# Patient Record
Sex: Male | Born: 1975 | Race: White | Hispanic: No | Marital: Single | Smoking: Former smoker
Health system: Southern US, Community
[De-identification: ages and names within clinical notes are randomized; demographics above are authoritative.]

## PROBLEM LIST (undated history)

## (undated) DIAGNOSIS — F329 Major depressive disorder, single episode, unspecified: Secondary | ICD-10-CM

## (undated) DIAGNOSIS — F1010 Alcohol abuse, uncomplicated: Secondary | ICD-10-CM

## (undated) DIAGNOSIS — K219 Gastro-esophageal reflux disease without esophagitis: Secondary | ICD-10-CM

## (undated) DIAGNOSIS — F32A Depression, unspecified: Secondary | ICD-10-CM

## (undated) DIAGNOSIS — E785 Hyperlipidemia, unspecified: Secondary | ICD-10-CM

## (undated) HISTORY — DX: Hyperlipidemia, unspecified: E78.5

## (undated) HISTORY — DX: Depression, unspecified: F32.A

## (undated) HISTORY — DX: Alcohol abuse, uncomplicated: F10.10

## (undated) HISTORY — DX: Gastro-esophageal reflux disease without esophagitis: K21.9

## (undated) HISTORY — DX: Major depressive disorder, single episode, unspecified: F32.9

---
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
## 2003-01-02 ENCOUNTER — Encounter: Payer: Self-pay | Admitting: Family Medicine

## 2008-07-01 ENCOUNTER — Ambulatory Visit: Payer: Self-pay | Admitting: Family Medicine

## 2008-07-01 DIAGNOSIS — K259 Gastric ulcer, unspecified as acute or chronic, without hemorrhage or perforation: Secondary | ICD-10-CM | POA: Insufficient documentation

## 2008-07-01 DIAGNOSIS — K219 Gastro-esophageal reflux disease without esophagitis: Secondary | ICD-10-CM

## 2008-07-01 DIAGNOSIS — T7840XA Allergy, unspecified, initial encounter: Secondary | ICD-10-CM | POA: Insufficient documentation

## 2008-07-01 DIAGNOSIS — F3341 Major depressive disorder, recurrent, in partial remission: Secondary | ICD-10-CM

## 2008-07-02 LAB — CONVERTED CEMR LAB
ALT: 35 units/L (ref 0–53)
AST: 29 units/L (ref 0–37)
Albumin: 4.7 g/dL (ref 3.5–5.2)
BUN: 9 mg/dL (ref 6–23)
Calcium: 9.7 mg/dL (ref 8.4–10.5)
Creatinine, Ser: 1.2 mg/dL (ref 0.4–1.5)
H Pylori IgG: NEGATIVE
Lipase: 19 units/L (ref 11.0–59.0)
Total Protein: 8.4 g/dL — ABNORMAL HIGH (ref 6.0–8.3)

## 2008-07-30 ENCOUNTER — Ambulatory Visit: Payer: Self-pay | Admitting: Family Medicine

## 2008-09-01 ENCOUNTER — Ambulatory Visit: Payer: Self-pay | Admitting: Family Medicine

## 2009-01-05 ENCOUNTER — Ambulatory Visit: Payer: Self-pay | Admitting: Family Medicine

## 2009-01-05 ENCOUNTER — Encounter (INDEPENDENT_AMBULATORY_CARE_PROVIDER_SITE_OTHER): Payer: Self-pay | Admitting: *Deleted

## 2009-02-02 ENCOUNTER — Ambulatory Visit: Payer: Self-pay | Admitting: Gastroenterology

## 2009-02-18 ENCOUNTER — Ambulatory Visit: Payer: Self-pay | Admitting: Family Medicine

## 2009-04-16 ENCOUNTER — Ambulatory Visit: Payer: Self-pay | Admitting: Family Medicine

## 2010-02-23 NOTE — Assessment & Plan Note (Signed)
Summary: 4-5 WEEK FOLLOW UP/RBH   Vital Signs:  Patient profile:   35 year old male Height:      68 inches Weight:      176.6 pounds Temp:     98.0 degrees F oral Pulse rate:   68 / minute Pulse rhythm:   regular BP sitting:   110 / 70  Vitals Entered By: Benny Lennert CMA Duncan Dull) (April 16, 2009 7:56 AM) CC: 4-5 week follow up , Depression   Depression History:      The patient comes in today for a maintenance visit due to his depression.  He notes that the symptoms started approximately 03/24/2008.  The patient denies a depressed mood most of the day and a diminished interest in his usual daily activities.  The patient denies insomnia, hypersomnia, psychomotor agitation, fatigue (loss of energy), feelings of worthlessness (guilt), impaired concentration (indecisiveness), and recurrent thoughts of death or suicide.        The patient denies that he feels like life is not worth living, denies that he wishes that he were dead, and denies that he has thought about ending his life.        Comments:  DOING MUCH BETTER.  Depression Treatment History:  Prior Medication Used:   Start Date: Assessment of Effect:   Comments:  Wellbutrin (bupropion)     --     much improvement     --  Allergies (verified): No Known Drug Allergies   Impression & Recommendations:  Problem # 1:  DEPRESSION (ICD-311) >15 minutes spent in face to face time with patient, >50% spent in counselling or coordination of care: reviewed symptoms, interested in doing things more, more active getting out hiking, smiling and happier, work is going better, and overall doing well.   His updated medication list for this problem includes:    Bupropion Hcl 300 Mg Xr24h-tab (Bupropion hcl) .Marland Kitchen... 1 by mouth daily    Citalopram Hydrobromide 20 Mg Tabs (Citalopram hydrobromide) .Marland Kitchen... 1 by mouth daily  Complete Medication List: 1)  Bupropion Hcl 300 Mg Xr24h-tab (Bupropion hcl) .Marland Kitchen.. 1 by mouth daily 2)  Lansoprazole 15 Mg  Cpdr (Lansoprazole) .Marland Kitchen.. 1 by mouth 30 minutes before breakfast 3)  Anamantle Hc 3-0.5 % Crea (Lidocaine-hydrocortisone ace) .... Use rectally two times a day 4)  Citalopram Hydrobromide 20 Mg Tabs (Citalopram hydrobromide) .Marland Kitchen.. 1 by mouth daily  Current Allergies (reviewed today): No known allergies

## 2010-02-23 NOTE — Assessment & Plan Note (Signed)
Summary: RECTAL BLEED & HX OF ULCER/YF   History of Present Illness Visit Type: Initial Consult Primary GI MD: Elie Goody MD [facility] Primary Provider: Hannah Beat, MD Requesting Provider: Hannah Beat, MD Chief Complaint: Rectal bleeding, BRB on tissue and in toilet; ulcer has not healed completely History of Present Illness:   This is a 35 year old white male relates a several year history of intermittent small volume hematochezia and epigastric pain with reflux symptoms and solid food dysphagia. He states he was diagnosed with an ulcer based on dyspeptic symptoms around 2007 when he lived in [place]. He has not previously had an upper GI series or upper endoscopy.  He also has had chronic constipation with occasional loose stools and small amounts of bright red blood per rectum associated with bowel movements. Bleeding has recently occurred about twice a week and has had several episodes of large amounts of blood per rectum. He states he takes his lansoprazole intermittently although he feels better when he takes a regularly. Over the past several weeks he has noted rectal pain with bowel movements, and a small fissure was noted on rectal examination by Dr. Patsy Lager.    GI Review of Systems    Reports abdominal pain, acid reflux, dysphagia with solids, loss of appetite, and  nausea.     Location of  Abdominal pain: epigastric area.    Denies belching, bloating, chest pain, dysphagia with liquids, heartburn, vomiting, vomiting blood, weight loss, and  weight gain.      Reports constipation, rectal bleeding, and  rectal pain.     Denies anal fissure, black tarry stools, change in bowel habit, diarrhea, diverticulosis, fecal incontinence, heme positive stool, hemorrhoids, irritable bowel syndrome, jaundice, light color stool, and  liver problems. Preventive Screening-Counseling & Management  Alcohol-Tobacco     Smoking Status: quit  Current Medications (verified): 1)  Bupropion  Hcl 300 Mg Xr24h-Tab (Bupropion Hcl) .Marland Kitchen.. 1 By Mouth Daily 2)  Lansoprazole 15 Mg Cpdr (Lansoprazole) .Marland Kitchen.. 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 Tonsillectomy 1990  Family History: Reviewed history from 07/01/2008 and no changes required. Family History Depression Family History Diabetes 1st degree relative Family History High cholesterol Family History Hypertension Family History of  skin cancer  Social History: Occupation: Facilities manager Alcohol use-yes (2-7 drinks a night) Drug use-no Regular exercise-no Patient is a former smoker.  Daily Caffeine Use 3-4 Smoking Status:  quit  Review of Systems       The patient complains of anxiety-new, change in vision, depression-new, sleeping problems, and vision changes.         The pertinent positives and negatives are noted as above and in the HPI. All other ROS were reviewed and were negative.   Vital Signs:  Patient profile:   35 year old male Height:      68 inches Weight:      176 pounds BMI:     26.86 Pulse rate:   68 / minute Pulse rhythm:   regular BP sitting:   110 / 78  (right arm) Cuff size:   regular  Vitals Entered By: June McMurray CMA Duncan Dull) (February 02, 2009 3:29 PM)  Physical Exam  General:  Well developed, well nourished, no acute distress. Head:  Normocephalic and atraumatic. Eyes:  PERRLA,  no icterus. Mouth:  No deformity or lesions, dentition normal. Chest Wall:  Tenderness over left anterior chest wall. Lungs:  Clear throughout to auscultation. Heart:  Regular rate and rhythm; no murmurs, rubs,  or bruits. Abdomen:  Soft, nontender and nondistended. No masses, hepatosplenomegaly or hernias noted. Normal bowel  sounds. Rectal:  deferred until time of colonoscopy. Rrecent exam by Dr. Patsy Lager revealed a small fissure and no other abnormalities. Neurologic:  Alert and  oriented x4;  grossly normal neurologically. Psych:  Alert and cooperative. depressed, flat affect.    Impression & Recommendations:  Problem # 1:  GERD (ICD-530.81) Chronic reflux symptoms, which are controlled with medications however the patient takes them sporadically. He is strongly advised to take lansoprazole on a daily basis at least for the next 4-6 weeks. The risks, benefits and alternatives to endoscopy with possible biopsy and possible dilation were discussed with the patient and they consent to proceed. The procedure will be scheduled electively. The patient states he cannot schedule at this time due to the requirement to have a driver. He states he will call back.  Problem # 2:  ABDOMINAL PAIN, EPIGASTRIC (ICD-789.06) Probably related to reflux disease. Rule out ulcer disease. EGD as above.  Problem # 3:  BLOOD IN STOOL (ICD-578.1) Recurrent rectal bleeding and new onset rectal pain. Has a small anal fissure, recently diagnosed. Rule out colorectal, neoplasms, iInternal hemorrhoids, inflammatory bowel disease, and other disorders. The risks, benefits and alternatives to colonoscopy with possible biopsy and possible polypectomy were discussed with the patient and they consent to proceed. The procedure will be scheduled electively. The patient states he cannot schedule at this time due to the requirement to have a driver. He states he will call back.  Problem # 4:  CONSTIPATION (ICD-564.00) Increase dietary fiber and fluid intake.  Problem # 5:  DYSPHAGIA (ICD-787.29) Solid food dysphagia, likely related to stricture. Rule out other disorders. Further evaluation treatment with endoscopy with possible dilation as above.  Patient Instructions: 1)  Call back to schedule your Endoscopy/ Colonoscopy after you find out who can bring  you to the procedure.  2)  Copy sent to : Hannah Beat, MD 3)  The medication list was reviewed and reconciled.  All changed / newly prescribed medications were explained.  A complete medication list was provided to the patient / caregiver. 4)  Avoid foods high in acid content ( tomatoes, citrus juices, spicy foods) . Avoid eating within 3 to 4 hours of lying down or before exercising. Do not over eat; try smaller more frequent meals. Elevate head of bed four inches when sleeping.  5)  High Fiber, Low Fat  Healthy Eating Plan brochure given.   Appended Document: RECTAL BLEED & HX OF ULCER/YF    Clinical Lists Changes  Medications: Added new medication of ANAMANTLE HC 3-0.5 % CREA (LIDOCAINE-HYDROCORTISONE ACE) use rectally two times a day - Signed Rx of ANAMANTLE HC 3-0.5 % CREA (LIDOCAINE-HYDROCORTISONE ACE) use rectally two times a day;  #1 tube x 2;  Signed;  Entered by: Christie Nottingham CMA (AAMA);  Authorized by: Meryl Dare MD [facility];  Method used: Electronically to [facility] [address], [place], [place], [place]  [address], Ph: [phone], Fax: [phone]    Prescriptions: ANAMANTLE HC 3-0.5 % CREA (LIDOCAINE-HYDROCORTISONE ACE) use rectally two times a day  #1 tube x 2   Entered by:   Christie Nottingham CMA (AAMA)   Authorized by:   Meryl Dare MD [facility]  Signed by:   Christie Nottingham CMA (AAMA) on 02/03/2009   Method used:   [facility]ically [facility]        [facility]       [address]       [place], [place]  [address]       Ph: [phone]       Fax: [phone]   RxID:   [id number]

## 2010-02-23 NOTE — Assessment & Plan Note (Signed)
Summary: 6 WK F/U DLO   Vital Signs:  Patient profile:   35 year old male Height:      68 inches Weight:      175.4 pounds BMI:     26.77 Temp:     98.0 degrees F oral Pulse rate:   68 / minute Pulse rhythm:   regular BP sitting:   100 / 72  (left arm) Cuff size:   regular  Vitals Entered By: Benny Lennert CMA Duncan Dull) (February 18, 2009 8:03 AM)  History of Present Illness: Chief complaint 6 wk follow up  F/u dysphagia, GI pain, GI bleed  Recently saw Dr Russella Dar  On Lansoprazole, rec endoscopy and colonoscopy   Maybe still with some dysphagia, abd pain improved with daily gerd meds, compliance better.  Taking meds all the time.  Still sometimes will get upset sometimes. Better but still there. Sleep good. Some guilt with no reason.  No exercise, going skiing next week for a week.  Also was on another med.  Has not followed.   Depression History:      The patient denies a depressed mood most of the day but notes a diminished interest in his usual daily activities.  Positive alarm features for depression include psychomotor retardation, fatigue (loss of energy), and feelings of worthlessness (guilt).  However, he denies insomnia, hypersomnia, impaired concentration (indecisiveness), and recurrent thoughts of death or suicide.  The patient denies symptoms of a manic disorder including persistently & abnormally elevated mood, abnormally & persistently irritable mood, less need for sleep, talkative or feels need to keep talking, distractibility, flight of ideas, increase in goal-directed activity, psychomotor agitation, inflated self-esteem or grandiosity, excessive buying sprees, excessive sexual indiscretions, and excessive foolish business investments.        The patient denies that he feels like life is not worth living, denies that he wishes that he were dead, and denies that he has thought about ending his life.         Depression Treatment History (Reviewed):  Prior  Medication Used:   Start Date: Assessment of Effect:   Comments:  Wellbutrin (bupropion)     --     much improvement     --  Allergies (verified): No Known Drug Allergies  Past History:  Past medical, surgical, family and social histories (including risk factors) reviewed, and no changes noted (except as noted below).  Past Medical History: Reviewed history from 02/02/2009 and no changes required. ALLERGY (ICD-995.3) GASTRIC ULCER (ICD-531.90) DEPRESSION (ICD-311) ASTHMA, childhood only GERD Anal Fissure  Past Surgical History: Reviewed history from 07/01/2008 and no changes required. Tonsillectomy 1990  Family History: Reviewed history from 07/01/2008 and no changes required. Family History Depression Family History Diabetes 1st degree relative Family History High cholesterol Family History Hypertension Family History of  skin cancer  Social History: Reviewed history from 02/02/2009 and no changes required. Occupation: Facilities manager Alcohol use-yes (2-7 drinks a night) Drug use-no Regular exercise-no Patient is a former smoker.  Daily Caffeine Use 3-4  Review of Systems       ROS: GEN: No acute illnesses, no fevers, chills, sweats, fatigue, weight loss, or URI sx. GI: No n/v/d Pulm: No SOB, cough, wheezing Interactive and getting along well at home.  Otherwise, ROS is as per the HPI.   Physical Exam  Additional Exam:  GEN: Well-developed,well-nourished,in no acute distress; alert,appropriate and cooperative throughout examination HEENT: Normocephalic and atraumatic without obvious abnormalities. No apparent alopecia or balding. Ears, externally no deformities  PULM: Breathing comfortably in no respiratory distress EXT: No clubbing, cyanosis, or edema PSYCH: Normally interactive. Cooperative during the interview. Pleasant. Friendly and conversant. Not anxious or depressed appearing. Normal, full affect.  ABD: S, NT, ND, + BS   Impression &  Recommendations:  Problem # 1:  ABDOMINAL PAIN, EPIGASTRIC (ICD-789.06) Assessment Improved improved  c/w lansoprazole and f/u with GI recs  Problem # 2:  DYSPHAGIA (ZOX-096.04) Assessment: Unchanged f/u for endoscopy  Problem # 3:  DEPRESSION (ICD-311) unstable, add celexa, encourage psychology follow-up  His updated medication list for this problem includes:    Bupropion Hcl 300 Mg Xr24h-tab (Bupropion hcl) .Marland Kitchen... 1 by mouth daily    Citalopram Hydrobromide 20 Mg Tabs (Citalopram hydrobromide) .Marland Kitchen... 1 by mouth daily  Complete Medication List: 1)  Bupropion Hcl 300 Mg Xr24h-tab (Bupropion hcl) .Marland Kitchen.. 1 by mouth daily 2)  Lansoprazole 15 Mg Cpdr (Lansoprazole) .Marland Kitchen.. 1 by mouth 30 minutes before breakfast 3)  Anamantle Hc 3-0.5 % Crea (Lidocaine-hydrocortisone ace) .... Use rectally two times a day 4)  Citalopram Hydrobromide 20 Mg Tabs (Citalopram hydrobromide) .Marland Kitchen.. 1 by mouth daily  Patient Instructions: 1)  f/u 4-5 weeks 2)  follow-up with Gastroenterology about endoscopies Prescriptions: BUPROPION HCL 300 MG XR24H-TAB (BUPROPION HCL) 1 by mouth daily  #30 x 11   Entered and Authorized by:   Hannah Beat MD   Signed by:   Hannah Beat MD on 02/18/2009   Method used:   [facility]ronically to        [facility]       [address]       [place], [place]  [address]       Ph: [phone]       Fax: [phone]   RxID:   [id number] CITALOPRAM HYDROBROMIDE 20 MG TABS (CITALOPRAM HYDROBROMIDE) 1 by mouth daily  #30 x 3   Entered and Authorized by:   Hannah Beat MD   Signed by:   Hannah Beat MD on 02/18/2009   Method used:   Electronically to        [facility]       [address]       [place], [place]  [address]       Ph: [phone]       Fax: [phone]   RxID:   [id number]   Current Allergies (reviewed today): No known allergies

## 2010-03-11 ENCOUNTER — Telehealth (INDEPENDENT_AMBULATORY_CARE_PROVIDER_SITE_OTHER): Payer: Self-pay | Admitting: *Deleted

## 2010-03-15 ENCOUNTER — Encounter (INDEPENDENT_AMBULATORY_CARE_PROVIDER_SITE_OTHER): Payer: Self-pay | Admitting: *Deleted

## 2010-03-15 ENCOUNTER — Other Ambulatory Visit (INDEPENDENT_AMBULATORY_CARE_PROVIDER_SITE_OTHER): Payer: 59

## 2010-03-15 ENCOUNTER — Other Ambulatory Visit: Payer: Self-pay | Admitting: Family Medicine

## 2010-03-15 DIAGNOSIS — Z13220 Encounter for screening for lipoid disorders: Secondary | ICD-10-CM

## 2010-03-15 DIAGNOSIS — R5381 Other malaise: Secondary | ICD-10-CM | POA: Insufficient documentation

## 2010-03-15 DIAGNOSIS — Z131 Encounter for screening for diabetes mellitus: Secondary | ICD-10-CM

## 2010-03-15 DIAGNOSIS — E785 Hyperlipidemia, unspecified: Secondary | ICD-10-CM

## 2010-03-15 DIAGNOSIS — R5383 Other fatigue: Secondary | ICD-10-CM

## 2010-03-15 LAB — CBC WITH DIFFERENTIAL/PLATELET
Basophils Absolute: 0.1 10*3/uL (ref 0.0–0.1)
Eosinophils Absolute: 0.3 10*3/uL (ref 0.0–0.7)
Hemoglobin: 15.6 g/dL (ref 13.0–17.0)
Lymphocytes Relative: 26.6 % (ref 12.0–46.0)
MCHC: 35.2 g/dL (ref 30.0–36.0)
Monocytes Relative: 8.3 % (ref 3.0–12.0)
Neutrophils Relative %: 59.6 % (ref 43.0–77.0)
RDW: 12.9 % (ref 11.5–14.6)

## 2010-03-15 LAB — HEPATIC FUNCTION PANEL
ALT: 39 U/L (ref 0–53)
Albumin: 4.1 g/dL (ref 3.5–5.2)
Alkaline Phosphatase: 55 U/L (ref 39–117)
Bilirubin, Direct: 0.2 mg/dL (ref 0.0–0.3)
Total Protein: 7.3 g/dL (ref 6.0–8.3)

## 2010-03-15 LAB — BASIC METABOLIC PANEL
CO2: 27 mEq/L (ref 19–32)
Calcium: 9.4 mg/dL (ref 8.4–10.5)
Chloride: 105 mEq/L (ref 96–112)
Creatinine, Ser: 1.2 mg/dL (ref 0.4–1.5)
Glucose, Bld: 108 mg/dL — ABNORMAL HIGH (ref 70–99)

## 2010-03-15 LAB — LIPID PANEL
Cholesterol: 274 mg/dL — ABNORMAL HIGH (ref 0–200)
Total CHOL/HDL Ratio: 4
Triglycerides: 303 mg/dL — ABNORMAL HIGH (ref 0.0–149.0)

## 2010-03-15 LAB — LDL CHOLESTEROL, DIRECT: Direct LDL: 180.4 mg/dL

## 2010-03-17 NOTE — Progress Notes (Signed)
----   Converted from flag ---- ---- 03/11/2010 12:13 PM, Hannah Beat MD wrote: Prephysical Labs, several days before, fasting BMP, HFP, FLP, CBC with diff: v77.91, v77.1, ,780.79  ---- 03/09/2010 10:45 AM, Liane Comber CMA (AAMA) wrote: Lab orders please! Good Morning! This pt is scheduled for cpx labs Monday, which labs to draw and dx codes to use? Thanks Tasha ------------------------------

## 2010-03-18 ENCOUNTER — Encounter: Payer: Self-pay | Admitting: Family Medicine

## 2010-03-18 ENCOUNTER — Encounter (INDEPENDENT_AMBULATORY_CARE_PROVIDER_SITE_OTHER): Payer: 59 | Admitting: Family Medicine

## 2010-03-18 DIAGNOSIS — Z0000 Encounter for general adult medical examination without abnormal findings: Secondary | ICD-10-CM

## 2010-04-01 NOTE — Assessment & Plan Note (Signed)
Summary: cpx/alc   Vital Signs:  Patient profile:   35 year old male Weight:      183 pounds BMI:     27.93 O2 Sat:      98 % on Room air Temp:     98.6 degrees F oral Pulse rate:   86 / minute Pulse rhythm:   regular BP sitting:   112 / 78  (left arm) Cuff size:   large  Vitals Entered By: Mervin Hack CMA Duncan Dull) (March 18, 2010 8:39 AM)  O2 Flow:  Room air CC: adult physical   History of Present Illness:   35 yo for CPX:  still drinking 6-7 beers a night no tob  Hyperlipidemia, very poor diet. "pizza, cheese, and meat" few veggies. exercises a few days a week.  Preventive Screening-Counseling & Management  Alcohol-Tobacco     Alcohol drinks/day: >5     Alcohol type: beer     >5/day in last 3 mos: yes     Alcohol Counseling: to decrease amount and/or frequency of alcohol intake     Feels need to cut down: yes     Smoking Status: quit  Caffeine-Diet-Exercise     Diet Comments: very poor     Diet Counseling: to improve diet; diet is suboptimal     Does Patient Exercise: yes, some     Exercise (avg: min/session): 30-60     Times/week: <3     Exercise Counseling: to improve exercise regimen  Hep-HIV-STD-Contraception     STD Risk: no risk noted     Testicular SE Education/Counseling to perform regular STE  Safety-Violence-Falls     Seat Belt Use: yes      Drug Use:  never.    Clinical Review Panels:  Lipid Management   Cholesterol:  274 (03/15/2010)   HDL (good cholesterol):  62.00 (03/15/2010)  CBC   WBC:  6.4 (03/15/2010)   RBC:  4.61 (03/15/2010)   Hgb:  15.6 (03/15/2010)   Hct:  44.5 (03/15/2010)   Platelets:  223.0 (03/15/2010)   MCV  96.7 (03/15/2010)   MCHC  35.2 (03/15/2010)   RDW  12.9 (03/15/2010)   PMN:  59.6 (03/15/2010)   Lymphs:  26.6 (03/15/2010)   Monos:  8.3 (03/15/2010)   Eosinophils:  4.7 (03/15/2010)   Basophil:  0.8 (03/15/2010)  Complete Metabolic Panel   Glucose:  108 (03/15/2010)   Sodium:  138 (03/15/2010)  Potassium:  4.5 (03/15/2010)   Chloride:  105 (03/15/2010)   CO2:  27 (03/15/2010)   BUN:  15 (03/15/2010)   Creatinine:  1.2 (03/15/2010)   Albumin:  4.1 (03/15/2010)   Total Protein:  7.3 (03/15/2010)   Calcium:  9.4 (03/15/2010)   Total Bili:  0.8 (03/15/2010)   Alk Phos:  55 (03/15/2010)   SGPT (ALT):  39 (03/15/2010)   SGOT (AST):  32 (03/15/2010)   Allergies: No Known Drug Allergies  Past History:  Past medical, surgical, family and social histories (including risk factors) reviewed, and no changes noted (except as noted below). Past medical, surgical, family and social histories (including risk factors) reviewed for relevance to current acute and chronic problems.  Past Medical History: ALLERGY (ICD-995.3) GASTRIC ULCER (ICD-531.90) DEPRESSION (ICD-311) ASTHMA, childhood only GERD  Past Surgical History: Reviewed history from 07/01/2008 and no changes required. Tonsillectomy 1990 PMH-FH-SH reviewed-no changes except otherwise noted  Family History: Reviewed history from 07/01/2008 and no changes required. Family History Depression Family History Diabetes 1st degree relative Family History High cholesterol Family History  Hypertension Family History of  skin cancer  Social History: Reviewed history from 02/02/2009 and no changes required. Occupation: Facilities manager Alcohol use-yes (2-7 drinks a night) Drug use-no Regular exercise-no Patient is a former smoker.  Daily Caffeine Use 3-4 Does Patient Exercise:  yes, some STD Risk:  no risk noted Drug Use:  never  Review of Systems  General: Denies fever, chills, sweats, anorexia, fatigue, weakness, malaise Eyes: Denies blurring, vision loss ENT: Denies earache, nasal congestion, nosebleeds, sore throat, and hoarseness.  Cardiovascular: Denies chest pains, palpitations, syncope, dyspnea on exertion,  Respiratory: Denies cough, dyspnea at rest, excessive sputum,wheeezing GI: Denies nausea,  vomiting, diarrhea, constipation, change in bowel habits, abdominal pain, melena, hematochezia GU: Denies dysuria, hematuria, discharge, urinary frequency, urinary hesitancy, nocturia, incontinence, genital sores, decreased libido Musculoskeletal: Denies back pain, joint pain Derm: Denies rash, itching Neuro: Denies  paresthesias, frequent falls, frequent headaches, and difficulty walking.  Psych: Denies depression, anxiety Endocrine: Denies cold intolerance, heat intolerance, polydipsia, polyphagia, polyuria, and unusual weight change.  Heme: Denies enlarged lymph nodes Allergy: No hayfever   Physical Exam  General:  Well-developed,well-nourished,in no acute distress; alert,appropriate and cooperative throughout examination Head:  Normocephalic and atraumatic without obvious abnormalities. No apparent alopecia or balding. Eyes:  No corneal or conjunctival inflammation noted. EOMI. Perrla. Vision grossly normal. Ears:  External ear exam shows no significant lesions or deformities.  Otoscopic examination reveals clear canals, tympanic membranes are intact bilaterally without bulging, retraction, inflammation or discharge. Hearing is grossly normal bilaterally. Nose:  External nasal examination shows no deformity or inflammation. Nasal mucosa are pink and moist without lesions or exudates. Mouth:  Oral mucosa and oropharynx without lesions or exudates.  Teeth in good repair. Neck:  No deformities, masses, or tenderness noted. Chest Wall:  No deformities, masses, tenderness or gynecomastia noted. Lungs:  Normal respiratory effort, chest expands symmetrically. Lungs are clear to auscultation, no crackles or wheezes. Heart:  Normal rate and regular rhythm. S1 and S2 normal without gallop, murmur, click, rub or other extra sounds. Abdomen:  Bowel sounds positive,abdomen soft and non-tender without masses, organomegaly or hernias noted. Genitalia:  Testes bilaterally descended without nodularity,  tenderness or masses. No scrotal masses or lesions. No penis lesions or urethral discharge. Msk:  No deformity or scoliosis noted of thoracic or lumbar spine.   Extremities:  No clubbing, cyanosis, edema, or deformity noted with normal full range of motion of all joints.   Neurologic:  alert & oriented X3 and gait normal.   Skin:  Intact without suspicious lesions or rashes Cervical Nodes:  No lymphadenopathy noted Psych:  Cognition and judgment appear intact. Alert and cooperative with normal attention span and concentration. No apparent delusions, illusions, hallucinations   Impression & Recommendations:  Problem # 1:  HEALTH MAINTENANCE EXAM (ICD-V70.0) The patient's preventative maintenance and recommended screening tests for an annual wellness exam were reviewed in full today. Brought up to date unless services declined.  Counselled on the importance of diet, exercise, and its role in overall health and mortality. The patient's FH and SH was reviewed, including their home life, tobacco status, and drug and alcohol status.   chol high - discussed risks and benefits with the patient, who declines med management at this time. diet is very poor, needs to exercise more.  decrease ETOH  Patient Instructions: 1)  f/u 6-8 months 2)  FLP before, 272.4   Orders Added: 1)  Est. Patient 18-39 years [[id number]]    Prior Medications: Current Allergies (reviewed  today): No known allergies

## 2010-05-11 ENCOUNTER — Encounter: Payer: Self-pay | Admitting: Family Medicine

## 2010-05-13 ENCOUNTER — Ambulatory Visit (INDEPENDENT_AMBULATORY_CARE_PROVIDER_SITE_OTHER): Payer: 59 | Admitting: Family Medicine

## 2010-05-13 ENCOUNTER — Encounter: Payer: Self-pay | Admitting: Family Medicine

## 2010-05-13 ENCOUNTER — Encounter: Payer: Self-pay | Admitting: *Deleted

## 2010-05-13 DIAGNOSIS — R42 Dizziness and giddiness: Secondary | ICD-10-CM

## 2010-05-13 MED ORDER — DIAZEPAM 2 MG PO TABS: 2.0000 mg | ORAL_TABLET | Freq: Three times a day (TID) | ORAL | Status: DC | PRN

## 2010-05-13 MED ORDER — MECLIZINE HCL 25 MG PO TABS: 25.0000 mg | ORAL_TABLET | Freq: Three times a day (TID) | ORAL | Status: DC | PRN

## 2010-05-13 NOTE — Patient Instructions (Signed)
Call me if you improving in 2-3 weeks for recheck

## 2010-05-13 NOTE — Progress Notes (Signed)
35 year old male:  Feeling really dizzy and nauseated. Sitting, headaches. Started on a flight out last week. HA, difficulty swallowing much of the flight.   Feeling dizzy. Feels like it is rushing.   Lightheaded. Feels like going to fall.  Also, with difficulty wallowin.  Having trouble swallowing. Throat feels like it is going to restrict.   In throat. Feels like will vomit.  Some diarrhea right now, 3-4 times.   In the large episode, had a little trouble breathing.  Started 1 week ago.  Subjective:     Mathew Vazquez is a 35 y.o. male who presents for evaluation of dizziness. The symptoms started 6 days ago and are unchanged. The attacks occur intermittently and last varying minutes. Positions that worsen symptoms: any motion, bending over, head back, lying down, rolling in bed to the left, rolling in bed to the right and turning head. Previous workup/treatments: none. Associated ear symptoms: none. Associated CNS symptoms: none. Recent infections: none. Head trauma: denied. Drug ingestion: none. Noise exposure: no occupational exposure. Family history: non-contributory.  + for nausea, some milld HA  Patient Active Problem List  Diagnoses  . DEPRESSION  . GERD  . GASTRIC ULCER  . ALLERGY  . OTHER MALAISE AND FATIGUE   Past Medical History  Diagnosis Date  . Allergy   . Depression   . Asthma   . GERD (gastroesophageal reflux disease)   . Gastric ulcer    Past Surgical History  Procedure Date  . Tonsillectomy 1990   History  Substance Use Topics  . Smoking status: Former Games developer  . Smokeless tobacco: Not on file  . Alcohol Use: Yes   No family history on file. No Known Allergies No current outpatient prescriptions on file prior to visit.     Review of Systems ROS: GEN: Acute illness details above GI: Tolerating PO intake GU: maintaining adequate hydration and urination Pulm: No SOB Interactive and getting along well at home.  Otherwise, ROS is as per  the HPI.   Objective:   GEN: WDWN, NAD, Non-toxic, A & O x 3 HEENT: Atraumatic, Normocephalic. Neck supple. No masses, No LAD. INDUCIBLE VERTIGO WITHOUT NYSTAGMUS WITH HEAD ROTATION Ears and Nose: No external deformity. CV: RRR, No M/G/R. No JVD. No thrill. No extra heart sounds. PULM: CTA B, no wheezes, crackles, rhonchi. No retractions. No resp. distress. No accessory muscle use. ABD: S, NT, ND, +BS. No rebound tenderness. No HSM.  EXTR: No c/c/e Neuro: CN 2-12 grossly intact. PERRLA. EOMI. Sensation intact throughout. Str 5/5 all extremities. DTR 2+. No clonus. A and o x 4. Romberg neg. Finger nose neg. Heel -shin neg.  PSYCH: Normally interactive. Conversant. Not depressed or anxious appearing.  Calm demeanor.       Assessment:    Vertigo    Plan:    Meclizine per medication orders. Benzodiazapine per medication orders. Follow up in 14 days.  if no improvement

## 2010-05-26 ENCOUNTER — Encounter: Payer: Self-pay | Admitting: Family Medicine

## 2010-05-26 ENCOUNTER — Ambulatory Visit (INDEPENDENT_AMBULATORY_CARE_PROVIDER_SITE_OTHER): Payer: 59 | Admitting: Family Medicine

## 2010-05-26 VITALS — BP 120/70 | HR 84 | Temp 97.7°F | Ht 68.0 in | Wt 169.1 lb

## 2010-05-26 DIAGNOSIS — R42 Dizziness and giddiness: Secondary | ICD-10-CM

## 2010-05-26 MED ORDER — MECLIZINE HCL 25 MG PO TABS: 25.0000 mg | ORAL_TABLET | Freq: Three times a day (TID) | ORAL | Status: AC | PRN

## 2010-05-26 MED ORDER — LORAZEPAM 1 MG PO TABS: ORAL_TABLET | ORAL | Status: DC

## 2010-05-26 NOTE — Progress Notes (Signed)
35 year old male:  F/u persistent vertigo.  Headache, throughout the day, then will not feel day. With a car riding.  Screens at work is othering him a lot.  Disoriented -- could not look at the distance.   Continues: Feeling really dizzy and nauseated. Sitting, headaches. Started on a flight.  Feeling dizzy. Feels like it is rushing.   Lightheaded. Feels like going to fall.  Started 4 weeks ago week ago.  Subjective:     Mathew Vazquez is a 35 y.o. male who presents for evaluation of dizziness. The symptoms started 4 weeks ago and are unchanged. The attacks occur intermittently and last varying minutes. Positions that worsen symptoms: any motion, bending over, head back, lying down, rolling in bed to the left, rolling in bed to the right and turning head. Meclizine and valium help Previous workup/treatments: none. Associated ear symptoms: none. Associated CNS symptoms: none. Recent infections: none. Head trauma: denied. Drug ingestion: none. Noise exposure: no occupational exposure. Family history: non-contributory.  + for nausea, some milld HA  Patient Active Problem List  Diagnoses  . DEPRESSION  . GERD  . GASTRIC ULCER  . ALLERGY  . OTHER MALAISE AND FATIGUE  . Vertigo   Past Medical History  Diagnosis Date  . Allergy   . Depression   . Asthma   . GERD (gastroesophageal reflux disease)   . Gastric ulcer    Past Surgical History  Procedure Date  . Tonsillectomy 1990   History  Substance Use Topics  . Smoking status: Former Games developer  . Smokeless tobacco: Not on file  . Alcohol Use: Yes   No family history on file. No Known Allergies Current Outpatient Prescriptions on File Prior to Visit  Medication Sig Dispense Refill  . diazepam (VALIUM) 2 MG tablet Take 1 tablet (2 mg total) by mouth every 8 (eight) hours as needed for anxiety.  30 tablet  0  . DISCONTD: meclizine (ANTIVERT) 25 MG tablet Take 1 tablet (25 mg total) by mouth 3 (three) times daily as needed  for dizziness or nausea.  30 tablet  1     Review of Systems ROS: GEN: Acute illness details above GI: Tolerating PO intake GU: maintaining adequate hydration and urination Pulm: No SOB Interactive and getting along well at home.  Otherwise, ROS is as per the HPI.   Objective:   GEN: WDWN, NAD, Non-toxic, A & O x 3 HEENT: Atraumatic, Normocephalic. Neck supple. No masses, No LAD. INDUCIBLE VERTIGO WITHOUT NYSTAGMUS WITH HEAD ROTATION Ears and Nose: No external deformity. CV: RRR, No M/G/R. No JVD. No thrill. No extra heart sounds. PULM: CTA B, no wheezes, crackles, rhonchi. No retractions. No resp. distress. No accessory muscle use. ABD: S, NT, ND, +BS. No rebound tenderness. No HSM.  EXTR: No c/c/e Neuro: CN 2-12 grossly intact. PERRLA. EOMI. Sensation intact throughout. Str 5/5 all extremities. DTR 2+. No clonus. A and o x 4. Romberg neg. Finger nose neg. Heel -shin neg.  PSYCH: Normally interactive. Conversant. Not depressed or anxious appearing.  Calm demeanor.       Assessment:    Vertigo  Headache   Plan:    Failure to improve with significant inducible vertigo x 1 month with significant headaches.  MRI without contrast of brain to evaluate cerebellum, pons, cranial nerve 8 for potential organic causes of continued dizziness of brain origin.  Consult ENT.

## 2010-05-26 NOTE — Patient Instructions (Signed)
REFERRAL: GO THE THE FRONT ROOM AT THE ENTRANCE OF OUR CLINIC, NEAR CHECK IN. ASK FOR MARION. SHE WILL HELP YOU SET UP YOUR REFERRAL. DATE: TIME:  

## 2010-06-01 ENCOUNTER — Ambulatory Visit: Payer: Self-pay | Admitting: Family Medicine

## 2010-06-07 ENCOUNTER — Encounter: Payer: Self-pay | Admitting: Family Medicine

## 2010-06-08 ENCOUNTER — Ambulatory Visit: Payer: Self-pay | Admitting: Otolaryngology

## 2010-06-11 ENCOUNTER — Encounter: Payer: Self-pay | Admitting: Family Medicine

## 2010-06-14 ENCOUNTER — Encounter: Payer: Self-pay | Admitting: Family Medicine

## 2010-06-14 ENCOUNTER — Ambulatory Visit (INDEPENDENT_AMBULATORY_CARE_PROVIDER_SITE_OTHER)
Admission: RE | Admit: 2010-06-14 | Discharge: 2010-06-14 | Disposition: A | Discharge status: Home / Self Care | Payer: 59 | Source: Ambulatory Visit | Attending: Family Medicine | Admitting: Family Medicine

## 2010-06-14 ENCOUNTER — Ambulatory Visit (INDEPENDENT_AMBULATORY_CARE_PROVIDER_SITE_OTHER): Payer: 59 | Admitting: Family Medicine

## 2010-06-14 VITALS — BP 122/74 | HR 80 | Temp 97.9°F | Ht 68.0 in | Wt 170.0 lb

## 2010-06-14 DIAGNOSIS — M25531 Pain in right wrist: Secondary | ICD-10-CM

## 2010-06-14 DIAGNOSIS — M25539 Pain in unspecified wrist: Secondary | ICD-10-CM

## 2010-06-14 MED ORDER — CLOTRIMAZOLE 1 % EX CREA: TOPICAL_CREAM | Freq: Two times a day (BID) | CUTANEOUS | Status: DC

## 2010-06-14 MED ORDER — NAPROXEN 500 MG PO TABS: 500.0000 mg | ORAL_TABLET | Freq: Two times a day (BID) | ORAL | Status: DC

## 2010-06-14 NOTE — Assessment & Plan Note (Signed)
xrays negative. Likely wrist sprain.  Stretching/strengthening exercises provided from [facility] pt advisor with instructions on how to do. Continue use of wrist brace (neutral wrist position), but also do stretching exercises. Ibuprofen. If not improving as expected, return for f/u with Dr. Patsy Lager.

## 2010-06-14 NOTE — Progress Notes (Signed)
Addended by: Eustaquio Boyden on: 06/14/2010 08:43 AM   Modules accepted: Orders

## 2010-06-14 NOTE — Patient Instructions (Signed)
Looks like wrist sprain. Continue wrist brace. Naprosyn scheduled for next 3 days with food then as needed. If not better in 2 wks, return to see [place] (appt with Dr. Patsy Lager). Good to meet you today, call us with questions.
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
## 2010-09-09 ENCOUNTER — Other Ambulatory Visit: Payer: Self-pay | Admitting: *Deleted

## 2010-09-09 NOTE — Telephone Encounter (Signed)
Last dispensed 05-13-2010

## 2010-09-10 MED ORDER — DIAZEPAM 2 MG PO TABS: 2.0000 mg | ORAL_TABLET | Freq: Three times a day (TID) | ORAL | Status: DC | PRN

## 2010-09-10 NOTE — Telephone Encounter (Signed)
Ok, #30, 1 refill

## 2010-10-11 ENCOUNTER — Ambulatory Visit: Payer: 59 | Admitting: Family Medicine

## 2010-10-13 ENCOUNTER — Ambulatory Visit: Payer: 59 | Admitting: Family Medicine

## 2010-10-20 ENCOUNTER — Encounter: Payer: Self-pay | Admitting: Family Medicine

## 2010-10-20 ENCOUNTER — Ambulatory Visit (INDEPENDENT_AMBULATORY_CARE_PROVIDER_SITE_OTHER): Payer: 59 | Admitting: Family Medicine

## 2010-10-20 DIAGNOSIS — R5383 Other fatigue: Secondary | ICD-10-CM

## 2010-10-20 DIAGNOSIS — R5381 Other malaise: Secondary | ICD-10-CM

## 2010-10-20 DIAGNOSIS — R7303 Prediabetes: Secondary | ICD-10-CM | POA: Insufficient documentation

## 2010-10-20 DIAGNOSIS — R7309 Other abnormal glucose: Secondary | ICD-10-CM

## 2010-10-20 DIAGNOSIS — E785 Hyperlipidemia, unspecified: Secondary | ICD-10-CM | POA: Insufficient documentation

## 2010-10-20 NOTE — Progress Notes (Signed)
  Subjective:    Patient ID: Mathew Vazquez, male    DOB: 05-18-1975, 35 y.o.   MRN: [id number]  HPI  Mathew Vazquez, a 35 y.o. male presents today in the office for the following:    Lipids: not on meds, wanted to try to lose weight, and he has lost about 20 pounds or more Lipids:    Component Value Date/Time   CHOL 274* 03/15/2010 0958   TRIG 303.0* 03/15/2010 0958   HDL 62.00 03/15/2010 0958   LDLDIRECT 58.7 10/20/2010 1604   VLDL 60.6* 03/15/2010 0958   CHOLHDL 4 03/15/2010 0958    Lab Results  Component Value Date   ALT 39 03/15/2010   AST 32 03/15/2010   ALKPHOS 55 03/15/2010   BILITOT 0.8 03/15/2010   2. Prediabetic Basic Metabolic Panel:    Component Value Date/Time   NA 140 10/20/2010 1604   K 4.6 10/20/2010 1604   CL 102 10/20/2010 1604   CO2 28 10/20/2010 1604   BUN 18 10/20/2010 1604   CREATININE 1.3 10/20/2010 1604   GLUCOSE 90 10/20/2010 1604   CALCIUM 9.5 10/20/2010 1604   Before this glucose > 100. Needs recheck  3. Fatigue and intermittent ED Alone and with another partner. Usually able to ejac, but not always.   The PMH, PSH, Social History, Family History, Medications, and allergies have been reviewed in [facility], and have been updated if relevant.   Review of Systems ROS: GEN: No acute illnesses, no fevers, chills. GI: No n/v/d, eating normally Pulm: No SOB Interactive and getting along well at home.  Otherwise, ROS is as per the HPI.     Objective:   Physical Exam   Physical Exam  Blood pressure 120/78, pulse 80, temperature 98.4 F (36.9 C), temperature source Oral, height 5\' 8"  (1.727 m), weight 167 lb 12.8 oz (76.114 kg), SpO2 99.00%.  GEN: WDWN, NAD, Non-toxic, A & O x 3 HEENT: Atraumatic, Normocephalic. Neck supple. No masses, No LAD. Ears and Nose: No external deformity. CV: RRR, No M/G/R. No JVD. No thrill. No extra heart sounds. PULM: CTA B, no wheezes, crackles, rhonchi. No retractions. No resp. distress. No accessory muscle  use. EXTR: No c/c/e NEURO Normal gait.  PSYCH: Normally interactive. Conversant. Not depressed or anxious appearing.  Calm demeanor.        Assessment & Plan:   1. Other and unspecified hyperlipidemia  LDL cholesterol, direct  2. Prediabetes  Basic metabolic panel  3. Fatigue  Testosterone    LDL and gluc has improved at this point.  Lab Results  Component Value Date   TESTOSTERONE 175.92* 10/20/2010   Quite low. Will call and discuss with patient on phone.

## 2010-10-22 LAB — BASIC METABOLIC PANEL
BUN: 18 mg/dL (ref 6–23)
CO2: 28 mEq/L (ref 19–32)
Chloride: 102 mEq/L (ref 96–112)
Creatinine, Ser: 1.3 mg/dL (ref 0.4–1.5)

## 2010-10-25 ENCOUNTER — Other Ambulatory Visit: Payer: Self-pay | Admitting: Family Medicine

## 2010-10-25 ENCOUNTER — Encounter: Payer: Self-pay | Admitting: Family Medicine

## 2010-10-25 DIAGNOSIS — E291 Testicular hypofunction: Secondary | ICD-10-CM

## 2011-04-06 ENCOUNTER — Ambulatory Visit (INDEPENDENT_AMBULATORY_CARE_PROVIDER_SITE_OTHER): Payer: 59 | Admitting: Family Medicine

## 2011-04-06 ENCOUNTER — Encounter: Payer: Self-pay | Admitting: *Deleted

## 2011-04-06 ENCOUNTER — Encounter: Payer: Self-pay | Admitting: Family Medicine

## 2011-04-06 VITALS — BP 120/80 | HR 85 | Temp 97.8°F | Wt 178.5 lb

## 2011-04-06 DIAGNOSIS — R112 Nausea with vomiting, unspecified: Secondary | ICD-10-CM

## 2011-04-06 DIAGNOSIS — K219 Gastro-esophageal reflux disease without esophagitis: Secondary | ICD-10-CM

## 2011-04-06 DIAGNOSIS — T7020XA Unspecified effects of high altitude, initial encounter: Secondary | ICD-10-CM

## 2011-04-06 DIAGNOSIS — F1010 Alcohol abuse, uncomplicated: Secondary | ICD-10-CM

## 2011-04-06 LAB — CBC WITH DIFFERENTIAL/PLATELET
Eosinophils Absolute: 0.4 10*3/uL (ref 0.0–0.7)
Eosinophils Relative: 4.8 % (ref 0.0–5.0)
HCT: 45.7 % (ref 39.0–52.0)
Hemoglobin: 15.5 g/dL (ref 13.0–17.0)
Lymphs Abs: 1.7 10*3/uL (ref 0.7–4.0)
MCV: 96.9 fl (ref 78.0–100.0)
Monocytes Absolute: 0.6 10*3/uL (ref 0.1–1.0)
Neutro Abs: 4.7 10*3/uL (ref 1.4–7.7)
Platelets: 275 10*3/uL (ref 150.0–400.0)
RDW: 12.9 % (ref 11.5–14.6)

## 2011-04-06 LAB — BASIC METABOLIC PANEL
BUN: 10 mg/dL (ref 6–23)
Chloride: 104 mEq/L (ref 96–112)
Glucose, Bld: 105 mg/dL — ABNORMAL HIGH (ref 70–99)
Potassium: 3.8 mEq/L (ref 3.5–5.1)

## 2011-04-06 MED ORDER — ACETAZOLAMIDE 250 MG PO TABS: 250.0000 mg | ORAL_TABLET | Freq: Three times a day (TID) | ORAL | Status: DC

## 2011-04-06 NOTE — Progress Notes (Signed)
Patient Name: Mathew Vazquez Date of Birth: 03-30-1975 Medical Record Number: [id number] Gender: male Date of Encounter: 04/06/2011  History of Present Illness:  Mathew Vazquez is a 36 y.o. very pleasant male patient who presents with the following:  When got back from [place] got back from [place] and has thrown up a couple of times. Had some acute mountain sickness while - nauseous, head hurt. No pulmonary edema.   0-2 BM, sometimes, black stools - pepto Not much alcohol in [place]. 4-6, to 12 Nausea with throwing up  Since he returned from [place], he has been drinking at least 4-6 beers a day up to 12 beers a day. He is thrown up a few x1-2 times. He also has experienced some dizziness. He has tried some meclizine and some Valium, which did help somewhat.  There is a recall on [employer] tuna that he likes to eat.   Patient Active Problem List  Diagnoses  . DEPRESSION  . GERD  . GASTRIC ULCER  . ALLERGY  . OTHER MALAISE AND FATIGUE  . Hypogonadism male   Past Medical History  Diagnosis Date  . Allergy   . Depression   . Asthma   . GERD (gastroesophageal reflux disease)   . Gastric ulcer   . Other and unspecified hyperlipidemia 10/20/2010  . Hypogonadism male 10/25/2010   Past Surgical History  Procedure Date  . Tonsillectomy 1990   History  Substance Use Topics  . Smoking status: Former Smoker    Quit date: 01/24/2001  . Smokeless tobacco: Not on file  . Alcohol Use: Yes     6-7 drinks a night   Family History  Problem Relation Age of Onset  . Depression      Family history  . Diabetes      1st degree relative  . Hyperlipidemia      Family history  . Hypertension      Family history  . Skin cancer      Family history   No Known Allergies  Medication list has been reviewed and updated.  Review of Systems: ROS: GEN: Acute illness details above GI: Tolerating PO intake, as above GU: maintaining adequate hydration and urination Pulm: No  SOB Interactive and getting along well at home.  Otherwise, ROS is as per the HPI.   Physical Examination: Filed Vitals:   04/06/11 1008  BP: 120/80  Pulse: 85  Temp: 97.8 F (36.6 C)  TempSrc: Oral  Weight: 178 lb 8 oz (80.967 kg)    There is no height on file to calculate BMI.   GEN: WDWN, NAD, Non-toxic, A & O x 3 HEENT: Atraumatic, Normocephalic. Neck supple. No masses, No LAD. Ears and Nose: No external deformity. CV: RRR, No M/G/R. No JVD. No thrill. No extra heart sounds. PULM: CTA B, no wheezes, crackles, rhonchi. No retractions. No resp. distress. No accessory muscle use. EXTR: No c/c/e NEURO Normal gait.  PSYCH: Normally interactive. Conversant. Not depressed or anxious appearing.  Calm demeanor.    Assessment and Plan:  1. Nausea & vomiting  Basic metabolic panel, CBC with Differential  2. Altitude sickness    3. GERD    4. Alcohol abuse      Medications Today: Meds ordered this encounter  Medications  . acetaZOLAMIDE (DIAMOX) 250 MG tablet    Sig: Take 1 tablet (250 mg total) by mouth 3 (three) times daily.    Dispense:  30 tablet    Refill:  0    Orders Placed This  Encounter  Procedures  . Basic metabolic panel  . CBC with Differential    Nausea and vomiting with unclear source. Occasional loose stool. The patient could be having some gastroenteritis, which is very much going around in her community. He also did have some acute altitude sickness, and while he is no longer at a high altitude, he also has been drinking an excessive amount of alcohol every day since he returned. This may have continued some of the symptoms. I have asked him to discontinue alcohol over the next week. He can take some Valium on a when necessary basis if he has any withdrawal symptoms.  Restart PPI. Check kidney and liver function. I have given him some Diamox for his next trip

## 2011-04-06 NOTE — Patient Instructions (Signed)
Prilosec or prevacid -- 30 minutes before breakfast

## 2011-04-18 ENCOUNTER — Other Ambulatory Visit: Payer: Self-pay

## 2011-04-18 ENCOUNTER — Encounter: Payer: Self-pay | Admitting: Family Medicine

## 2011-04-18 NOTE — Telephone Encounter (Signed)
Ok to refill 15, 0 refills  Needs to be used sparingly

## 2011-04-18 NOTE — Telephone Encounter (Signed)
rx called to pharmacy 

## 2011-04-18 NOTE — Telephone Encounter (Signed)
[facility] request refill Valium 2 mg. Pt last seen 04/06/11.Please advise.

## 2011-04-27 ENCOUNTER — Encounter: Payer: Self-pay | Admitting: Family Medicine

## 2011-04-27 ENCOUNTER — Ambulatory Visit (INDEPENDENT_AMBULATORY_CARE_PROVIDER_SITE_OTHER): Payer: 59 | Admitting: Family Medicine

## 2011-04-27 ENCOUNTER — Ambulatory Visit (INDEPENDENT_AMBULATORY_CARE_PROVIDER_SITE_OTHER)
Admission: RE | Admit: 2011-04-27 | Discharge: 2011-04-27 | Disposition: A | Discharge status: Home / Self Care | Payer: 59 | Source: Ambulatory Visit | Attending: Family Medicine | Admitting: Family Medicine

## 2011-04-27 VITALS — BP 120/72 | HR 75 | Temp 97.6°F | Ht 68.0 in | Wt 180.0 lb

## 2011-04-27 DIAGNOSIS — M79609 Pain in unspecified limb: Secondary | ICD-10-CM

## 2011-04-27 DIAGNOSIS — M79672 Pain in left foot: Secondary | ICD-10-CM

## 2011-04-27 NOTE — Progress Notes (Signed)
  Patient Name: Mathew Vazquez Date of Birth: 1975-07-22 Age: 36 y.o. Medical Record Number: [id number] Gender: male Date of Encounter: 04/27/2011  History of Present Illness:  Mathew Vazquez is a 36 y.o. very pleasant male patient who presents with the following:  Extended hike, will hurt on the left side -- maybe once or twice a year. Left foot, fifth metatarsal, distal shaft pain. It is now resolving and improving. This was over the weekend and hurting somewhat.  Past Medical History, Surgical History, Social History, Family History, Problem List, Medications, and Allergies have been reviewed and updated if relevant.  Review of Systems:  GEN: No fevers, chills. Nontoxic. Primarily MSK c/o today. MSK: Detailed in the HPI GI: tolerating PO intake without difficulty Neuro: No numbness, parasthesias, or tingling associated. Otherwise the pertinent positives of the ROS are noted above.    Physical Examination: Filed Vitals:   04/27/11 0925  BP: 120/72  Pulse: 75  Temp: 97.6 F (36.4 C)  TempSrc: Oral  Height: 5\' 8"  (1.727 m)  Weight: 180 lb (81.647 kg)  SpO2: 99%    Body mass index is 27.37 kg/(m^2).   GEN: WDWN, NAD, Non-toxic, Alert & Oriented x 3 HEENT: Atraumatic, Normocephalic.  Ears and Nose: No external deformity. EXTR: No clubbing/cyanosis/edema NEURO: Normal gait.  PSYCH: Normally interactive. Conversant. Not depressed or anxious appearing.  Calm demeanor.   FEET: L Echymosis: no Edema: no ROM: full LE B Gait: heel toe, non-antalgic MT pain: no Callus pattern: none Lateral Mall: NT Medial Mall: NT Talus: NT Navicular: NT Cuboid: NT Calcaneous: NT Metatarsals: NT 5th MT: NT Phalanges: NT Achilles: NT Plantar Fascia: NT Fat Pad: NT Peroneals: NT Post Tib: NT Great Toe: Nml motion Ant Drawer: neg ATFL: NT CFL: NT Deltoid: NT Other foot breakdown: none Long arch: preserved Transverse arch: preserved Hindfoot breakdown:  none Sensation: intact   Assessment and Plan: 1. Left foot pain  DG Foot Complete Left    XR, 3 view, foot series Indication: foot pain Findings: no evidence of acute fracture or dislocation   Benign exam. Reassured the patient. Encouraged him to buy a new hiking boots, and to get some cushioned insoles

## 2011-08-04 ENCOUNTER — Other Ambulatory Visit: Payer: Self-pay | Admitting: Family Medicine

## 2011-08-04 DIAGNOSIS — Z13220 Encounter for screening for lipoid disorders: Secondary | ICD-10-CM

## 2011-08-04 DIAGNOSIS — R5381 Other malaise: Secondary | ICD-10-CM

## 2011-08-10 ENCOUNTER — Other Ambulatory Visit (INDEPENDENT_AMBULATORY_CARE_PROVIDER_SITE_OTHER): Payer: 59

## 2011-08-10 DIAGNOSIS — R5381 Other malaise: Secondary | ICD-10-CM

## 2011-08-10 DIAGNOSIS — R5383 Other fatigue: Secondary | ICD-10-CM

## 2011-08-10 DIAGNOSIS — Z13220 Encounter for screening for lipoid disorders: Secondary | ICD-10-CM

## 2011-08-10 LAB — LIPID PANEL
Cholesterol: 275 mg/dL — ABNORMAL HIGH (ref 0–200)
HDL: 72 mg/dL (ref >39.00)
Total CHOL/HDL Ratio: 4
Triglycerides: 212 mg/dL — ABNORMAL HIGH (ref 0.0–149.0)
VLDL: 42.4 mg/dL — ABNORMAL HIGH (ref 0.0–40.0)

## 2011-08-10 LAB — HEPATIC FUNCTION PANEL
ALT: 35 U/L (ref 0–53)
AST: 33 U/L (ref 0–37)
Albumin: 4.2 g/dL (ref 3.5–5.2)
Alkaline Phosphatase: 57 U/L (ref 39–117)
Bilirubin, Direct: 0 mg/dL (ref 0.0–0.3)
Total Bilirubin: 0.6 mg/dL (ref 0.3–1.2)
Total Protein: 7.4 g/dL (ref 6.0–8.3)

## 2011-08-17 ENCOUNTER — Encounter: Payer: Self-pay | Admitting: Family Medicine

## 2011-08-17 ENCOUNTER — Ambulatory Visit (INDEPENDENT_AMBULATORY_CARE_PROVIDER_SITE_OTHER): Payer: 59 | Admitting: Family Medicine

## 2011-08-17 VITALS — BP 114/84 | HR 68 | Temp 97.9°F | Ht 67.75 in | Wt 181.0 lb

## 2011-08-17 DIAGNOSIS — Z0000 Encounter for general adult medical examination without abnormal findings: Secondary | ICD-10-CM

## 2011-08-17 MED ORDER — OMEPRAZOLE 20 MG PO CPDR: 20.0000 mg | DELAYED_RELEASE_CAPSULE | Freq: Every day | ORAL | Status: DC

## 2011-08-17 NOTE — Progress Notes (Signed)
Nature conservation officer at [facility] [address] [place] [address] Phone: [phone] Fax: [phone]  Date:  08/17/2011   Name:  Mathew Vazquez   DOB:  1975/05/15   MRN:  [id number]  PCP:  Hannah Beat, MD    Chief Complaint: Annual Exam   History of Present Illness:  Mathew Vazquez is a 36 y.o. very pleasant male patient who presents with the following:  Every weekend, sometimes up to [place]. Biking and camping.   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  2-6-7 a day drinks.   Wt Readings from Last 3 Encounters:  08/17/11 181 lb (82.101 kg)  04/27/11 180 lb (81.647 kg)  04/06/11 178 lb 8 oz (80.967 kg)   Preventative Health Maintenance Visit:  Health Maintenance Summary Reviewed and updated, unless pt declines services.  Tobacco History Reviewed. Alcohol: 2 to 6 drinks a day, usually more like 2 Exercise Habits: hiking or biking on the weekend STD concerns: no risk or activity to increase risk Drug Use: None Encouraged self-testicular check  Health Maintenance  Topic Date Due  . Tetanus/tdap  09/14/1995  . Influenza Vaccine  10/25/2011    Labs reviewed with the patient.  Results for orders placed in visit on 08/10/11  LIPID PANEL      Component Value Range   Cholesterol 275 (*) 0 - 200 mg/dL   Triglycerides 865.7 (*) 0.0 - 149.0 mg/dL   HDL 84.69  >62.95 mg/dL   VLDL 28.4 (*) 0.0 - 13.2 mg/dL   Total CHOL/HDL Ratio 4    HEPATIC FUNCTION PANEL      Component Value Range   Total Bilirubin 0.6  0.3 - 1.2 mg/dL   Bilirubin, Direct 0.0  0.0 - 0.3 mg/dL   Alkaline Phosphatase 57  39 - 117 U/L   AST 33  0 - 37 U/L   ALT 35  0 - 53 U/L   Total Protein 7.4  6.0 - 8.3 g/dL   Albumin 4.2  3.5 - 5.2 g/dL  LDL CHOLESTEROL, DIRECT      Component Value Range   Direct LDL 156.9       Past Medical History, Surgical History, Social History, Family History, Problem List, Medications, and Allergies have been reviewed and updated if relevant.  Current Outpatient  Prescriptions on File Prior to Visit  Medication Sig Dispense Refill  . diazepam (VALIUM) 2 MG tablet Take 1 tablet (2 mg total) by mouth every 8 (eight) hours as needed for anxiety.  30 tablet  1  . omeprazole (PRILOSEC) 20 MG capsule Take 20 mg by mouth daily.        Review of Systems:  General: Denies fever, chills, sweats. No significant weight loss. Eyes: Denies blurring,significant itching ENT: Denies earache, sore throat, and hoarseness. Cardiovascular: Denies chest pains, palpitations, dyspnea on exertion Respiratory: Denies cough, dyspnea at rest,wheeezing Breast: no concerns about lumps GI: Denies nausea, vomiting, diarrhea, constipation, change in bowel habits, abdominal pain, melena, hematochezia GU: Denies penile discharge, ED, urinary flow / outflow problems. No STD concerns. Musculoskeletal: Denies back pain, joint pain Derm: Denies rash, itching Neuro: Denies  paresthesias, frequent falls, frequent headaches Psych: Denies depression, anxiety Endocrine: Denies cold intolerance, heat intolerance, polydipsia Heme: Denies enlarged lymph nodes Allergy: No hayfever   Physical Examination: Filed Vitals:   08/17/11 1444  BP: 114/84  Pulse: 68  Temp: 97.9 F (36.6 C)   Filed Vitals:   08/17/11 1444  Height: 5' 7.75" (1.721 m)  Weight: 181 lb (82.101 kg)  Body mass index is 27.72 kg/(m^2). Ideal Body Weight: Weight in (lb) to have BMI = 25: 162.9   GEN: well developed, well nourished, no acute distress Eyes: conjunctiva and lids normal, PERRLA, EOMI ENT: TM clear, nares clear, oral exam WNL Neck: supple, no lymphadenopathy, no thyromegaly, no JVD Pulm: clear to auscultation and percussion, respiratory effort normal CV: regular rate and rhythm, S1-S2, no murmur, rub or gallop, no bruits, peripheral pulses normal and symmetric, no cyanosis, clubbing, edema or varicosities GI: soft, non-tender; no hepatosplenomegaly, masses; active bowel sounds all quadrants GU: no  hernia, testicular mass, penile discharge Lymph: no cervical, axillary or inguinal adenopathy MSK: gait normal, muscle tone and strength WNL, no joint swelling, effusions, discoloration, crepitus  SKIN: clear, good turgor, color WNL, no rashes, lesions, or ulcerations Neuro: normal mental status, normal strength, sensation, and motion Psych: alert; oriented to person, place and time, normally interactive and not anxious or depressed in appearance.  Assessment and Plan: 1. Routine general medical examination at a health care facility     The patient's preventative maintenance and recommended screening tests for an annual wellness exam were reviewed in full today. Brought up to date unless services declined.  Counselled on the importance of diet, exercise, and its role in overall health and mortality. The patient's FH and SH was reviewed, including their home life, tobacco status, and drug and alcohol status.   We talked a lot about his cholesterol being up again. He did really well last year with diet control, then started to eat poorly again, a lot of pizza and fast food. He would like to do better and thinks he can stick with diet control.  Lipids:    Component Value Date/Time   CHOL 275* 08/10/2011 1003   TRIG 212.0* 08/10/2011 1003   HDL 72.00 08/10/2011 1003   LDLDIRECT 156.9 08/10/2011 1003   VLDL 42.4* 08/10/2011 1003   CHOLHDL 4 08/10/2011 1003     Hannah Beat, MD

## 2012-08-01 ENCOUNTER — Other Ambulatory Visit: Payer: Self-pay

## 2012-08-01 MED ORDER — DIAZEPAM 2 MG PO TABS: 2.0000 mg | ORAL_TABLET | Freq: Three times a day (TID) | ORAL | Status: DC | PRN

## 2012-08-01 NOTE — Telephone Encounter (Signed)
Pt request refill diazepam to [facility]; pt is flying on 08/15/12.Please advise.

## 2012-08-01 NOTE — Telephone Encounter (Signed)
Ok, #30, 0 refills 

## 2012-08-01 NOTE — Telephone Encounter (Signed)
rx called to pharmacy 

## 2012-08-10 ENCOUNTER — Encounter: Payer: Self-pay | Admitting: *Deleted

## 2012-08-13 ENCOUNTER — Other Ambulatory Visit (INDEPENDENT_AMBULATORY_CARE_PROVIDER_SITE_OTHER): Payer: 59

## 2012-08-13 DIAGNOSIS — R5381 Other malaise: Secondary | ICD-10-CM

## 2012-08-13 DIAGNOSIS — E785 Hyperlipidemia, unspecified: Secondary | ICD-10-CM

## 2012-08-13 DIAGNOSIS — Z79899 Other long term (current) drug therapy: Secondary | ICD-10-CM

## 2012-08-13 LAB — CBC WITH DIFFERENTIAL/PLATELET
Basophils Relative: 0.6 % (ref 0.0–3.0)
Eosinophils Absolute: 0.3 10*3/uL (ref 0.0–0.7)
Lymphocytes Relative: 31.6 % (ref 12.0–46.0)
MCHC: 34.7 g/dL (ref 30.0–36.0)
MCV: 97.8 fl (ref 78.0–100.0)
Monocytes Absolute: 0.6 10*3/uL (ref 0.1–1.0)
Neutrophils Relative %: 51.2 % (ref 43.0–77.0)
Platelets: 224 10*3/uL (ref 150.0–400.0)
RBC: 4.65 Mil/uL (ref 4.22–5.81)
WBC: 5.8 10*3/uL (ref 4.5–10.5)

## 2012-08-13 LAB — BASIC METABOLIC PANEL
BUN: 17 mg/dL (ref 6–23)
CO2: 25 mEq/L (ref 19–32)
Calcium: 9.2 mg/dL (ref 8.4–10.5)
Chloride: 107 mEq/L (ref 96–112)
Creatinine, Ser: 1.1 mg/dL (ref 0.4–1.5)

## 2012-08-13 LAB — LIPID PANEL
Cholesterol: 260 mg/dL — ABNORMAL HIGH (ref 0–200)
Triglycerides: 443 mg/dL — ABNORMAL HIGH (ref 0.0–149.0)

## 2012-08-13 LAB — HEPATIC FUNCTION PANEL
ALT: 54 U/L — ABNORMAL HIGH (ref 0–53)
AST: 37 U/L (ref 0–37)
Albumin: 4.1 g/dL (ref 3.5–5.2)
Alkaline Phosphatase: 58 U/L (ref 39–117)

## 2012-08-13 LAB — LDL CHOLESTEROL, DIRECT: Direct LDL: 149.8 mg/dL

## 2012-08-13 LAB — TSH: TSH: 1.06 u[IU]/mL (ref 0.35–5.50)

## 2012-08-20 ENCOUNTER — Encounter: Payer: 59 | Admitting: Family Medicine

## 2012-08-21 ENCOUNTER — Encounter: Payer: Self-pay | Admitting: Radiology

## 2012-08-22 ENCOUNTER — Ambulatory Visit (INDEPENDENT_AMBULATORY_CARE_PROVIDER_SITE_OTHER): Payer: Managed Care, Other (non HMO) | Admitting: Family Medicine

## 2012-08-22 ENCOUNTER — Encounter: Payer: Self-pay | Admitting: Family Medicine

## 2012-08-22 VITALS — BP 122/84 | HR 97 | Temp 97.9°F | Ht 68.0 in | Wt 185.2 lb

## 2012-08-22 DIAGNOSIS — Z0000 Encounter for general adult medical examination without abnormal findings: Secondary | ICD-10-CM

## 2012-08-22 NOTE — Progress Notes (Signed)
[facility] [address] [place] [address] Phone: [phone] Fax: [phone]  Date:  08/22/2012   Name:  Mathew Vazquez   DOB:  [birth year]   MRN:  [id number] Gender: male Age: 37 y.o.  Primary Physician:  Hannah Beat, MD  Evaluating MD: Hannah Beat, MD   Chief Complaint: Annual Exam   History of Present Illness:  Mathew Vazquez is a 37 y.o. pleasant patient who presents with the following:  CPX:  Having some pain in his side. Started in shoulder blade, lower lat.   Preventative Health Maintenance Visit:  Health Maintenance Summary Reviewed and updated, unless pt declines services.  Tobacco History Reviewed. Alcohol: Increased, now about 10 drinks a day Exercise Habits: intermittent hiking STD concerns: no risk or activity to increase risk Drug Use: None Encouraged self-testicular check  Health Maintenance  Topic Date Due  . Tetanus/tdap  09/14/1995  . Influenza Vaccine  09/24/2012    Labs reviewed with the patient.  Results for orders placed in visit on 08/13/12  LIPID PANEL      Result Value Range   Cholesterol 260 (*) 0 - 200 mg/dL   Triglycerides 865.7 (*) 0.0 - 149.0 mg/dL   HDL 84.69  >62.95 mg/dL   VLDL 28.4 (*) 0.0 - 13.2 mg/dL   Total CHOL/HDL Ratio 5    HEPATIC FUNCTION PANEL      Result Value Range   Total Bilirubin 0.4  0.3 - 1.2 mg/dL   Bilirubin, Direct 0.0  0.0 - 0.3 mg/dL   Alkaline Phosphatase 58  39 - 117 U/L   AST 37  0 - 37 U/L   ALT 54 (*) 0 - 53 U/L   Total Protein 7.6  6.0 - 8.3 g/dL   Albumin 4.1  3.5 - 5.2 g/dL  TSH      Result Value Range   TSH 1.06  0.35 - 5.50 uIU/mL  BASIC METABOLIC PANEL      Result Value Range   Sodium 139  135 - 145 mEq/L   Potassium 3.9  3.5 - 5.1 mEq/L   Chloride 107  96 - 112 mEq/L   CO2 25  19 - 32 mEq/L   Glucose, Bld 99  70 - 99 mg/dL   BUN 17  6 - 23 mg/dL   Creatinine, Ser 1.1  0.4 - 1.5 mg/dL   Calcium 9.2  8.4 - 44.0 mg/dL   GFR  10.27  >25.36 mL/min  CBC WITH DIFFERENTIAL      Result Value Range   WBC 5.8  4.5 - 10.5 K/uL   RBC 4.65  4.22 - 5.81 Mil/uL   Hemoglobin 15.8  13.0 - 17.0 g/dL   HCT 64.4  03.4 - 74.2 %   MCV 97.8  78.0 - 100.0 fl   MCHC 34.7  30.0 - 36.0 g/dL   RDW 59.5  63.8 - 75.6 %   Platelets 224.0  150.0 - 400.0 K/uL   Neutrophils Relative % 51.2  43.0 - 77.0 %   Lymphocytes Relative 31.6  12.0 - 46.0 %   Monocytes Relative 11.0  3.0 - 12.0 %   Eosinophils Relative 5.6 (*) 0.0 - 5.0 %   Basophils Relative 0.6  0.0 - 3.0 %   Neutro Abs 3.0  1.4 - 7.7 K/uL   Lymphs Abs 1.8  0.7 - 4.0 K/uL   Monocytes Absolute 0.6  0.1 - 1.0 K/uL   Eosinophils Absolute 0.3  0.0 - 0.7 K/uL  Basophils Absolute 0.0  0.0 - 0.1 K/uL  LDL CHOLESTEROL, DIRECT      Result Value Range   Direct LDL 149.8       Wt Readings from Last 3 Encounters:  08/22/12 185 lb 4 oz (84.029 kg)  08/17/11 181 lb (82.101 kg)  04/27/11 180 lb (81.647 kg)     Patient Active Problem List   Diagnosis Date Noted  . Alcohol abuse 04/06/2011  . OTHER MALAISE AND FATIGUE 03/15/2010  . DEPRESSION 07/01/2008  . GERD 07/01/2008  . GASTRIC ULCER 07/01/2008  . ALLERGY 07/01/2008    Past Medical History  Diagnosis Date  . Allergy   . Depression   . Asthma   . GERD (gastroesophageal reflux disease)   . Gastric ulcer   . Other and unspecified hyperlipidemia 10/20/2010  . Hypogonadism male 10/25/2010    Past Surgical History  Procedure Laterality Date  . Tonsillectomy  1990    History   Social History  . Marital Status: Single    Spouse Name: N/A    Number of Children: N/A  . Years of Education: N/A   Occupational History  . insurance    Social History Main Topics  . Smoking status: Former Smoker    Quit date: 01/24/2001  . Smokeless tobacco: Not on file  . Alcohol Use: Yes     Comment: 6-7 drinks a night  . Drug Use: No  . Sexually Active: Not on file   Other Topics Concern  . Not on file   Social History  Narrative   Daily caffeine use 3-4 daily      No regular exercise    Family History  Problem Relation Age of Onset  . Depression      Family history  . Diabetes      1st degree relative  . Hyperlipidemia      Family history  . Hypertension      Family history  . Skin cancer      Family history    No Known Allergies  Medication list has been reviewed and updated.  Outpatient Prescriptions Prior to Visit  Medication Sig Dispense Refill  . diazepam (VALIUM) 2 MG tablet Take 1 tablet (2 mg total) by mouth every 8 (eight) hours as needed for anxiety.  30 tablet  0  . omeprazole (PRILOSEC) 20 MG capsule Take 1 capsule (20 mg total) by mouth daily.  90 capsule  3   No facility-administered medications prior to visit.    Review of Systems:   General: Denies fever, chills, sweats. No significant weight loss. Eyes: Denies blurring,significant itching ENT: Denies earache, sore throat, and hoarseness. Cardiovascular: Denies chest pains, palpitations, dyspnea on exertion Respiratory: Denies cough, dyspnea at rest,wheeezing Breast: no concerns about lumps GI: Denies nausea, vomiting, diarrhea, constipation, change in bowel habits, abdominal pain, melena, hematochezia GU: Denies penile discharge, ED, urinary flow / outflow problems. No STD concerns. Musculoskeletal: Denies back pain, joint pain Derm: Denies rash, itching Neuro: Denies  paresthesias, frequent falls, frequent headaches Psych: Denies depression, anxiety Endocrine: Denies cold intolerance, heat intolerance, polydipsia Heme: Denies enlarged lymph nodes Allergy: No hayfever  Physical Examination: BP 122/84  Pulse 97  Temp(Src) 97.9 F (36.6 C) (Oral)  Ht 5\' 8"  (1.727 m)  Wt 185 lb 4 oz (84.029 kg)  BMI 28.17 kg/m2  SpO2 96%  Ideal Body Weight: Weight in (lb) to have BMI = 25: 164.1  GEN: well developed, well nourished, no acute distress Eyes: conjunctiva and lids normal,  PERRLA, EOMI ENT: TM clear, nares  clear, oral exam WNL Neck: supple, no lymphadenopathy, no thyromegaly, no JVD Pulm: clear to auscultation and percussion, respiratory effort normal CV: regular rate and rhythm, S1-S2, no murmur, rub or gallop, no bruits, peripheral pulses normal and symmetric, no cyanosis, clubbing, edema or varicosities GI: soft, non-tender; no hepatosplenomegaly, masses; active bowel sounds all quadrants GU: no hernia, testicular mass, penile discharge Lymph: no cervical, axillary or inguinal adenopathy MSK: gait normal, muscle tone and strength WNL, no joint swelling, effusions, discoloration, crepitus  SKIN: clear, good turgor, color WNL, no rashes, lesions, or ulcerations Neuro: normal mental status, normal strength, sensation, and motion Psych: alert; oriented to person, place and time, normally interactive and not anxious or depressed in appearance.  Assessment and Plan:  Routine general medical examination at a health care facility   The patient's preventative maintenance and recommended screening tests for an annual wellness exam were reviewed in full today. Brought up to date unless services declined.  Counselled on the importance of diet, exercise, and its role in overall health and mortality. The patient's FH and SH was reviewed, including their home life, tobacco status, and drug and alcohol status.   Some time spent discussing his diet, which is really bad. +/- chol meds Also spent a lot of time discussing alcohol. I recommended stopping, but he is if at anything precontemplative.  Orders Today:  No orders of the defined types were placed in this encounter.    Updated Medication List: (Includes new medications, updates to list, dose adjustments) No orders of the defined types were placed in this encounter.    Medications Discontinued: Medications Discontinued During This Encounter  Medication Reason  . omeprazole (PRILOSEC) 20 MG capsule Patient Preference     Signed, Makana Feigel T.  Kinzly Pierrelouis, MD 08/22/2012 2:59 PM

## 2013-01-04 ENCOUNTER — Other Ambulatory Visit: Payer: Self-pay | Admitting: Family Medicine

## 2013-01-04 NOTE — Telephone Encounter (Signed)
Last office visit 08/22/2012.  Medication not on current list.  Ok to refill?

## 2013-02-20 ENCOUNTER — Encounter: Payer: Self-pay | Admitting: Family Medicine

## 2013-02-20 ENCOUNTER — Ambulatory Visit (INDEPENDENT_AMBULATORY_CARE_PROVIDER_SITE_OTHER): Payer: Managed Care, Other (non HMO) | Admitting: Family Medicine

## 2013-02-20 VITALS — BP 136/82 | HR 90 | Temp 98.1°F | Ht 68.0 in | Wt 199.8 lb

## 2013-02-20 DIAGNOSIS — S2249XA Multiple fractures of ribs, unspecified side, initial encounter for closed fracture: Secondary | ICD-10-CM

## 2013-02-20 DIAGNOSIS — S2241XA Multiple fractures of ribs, right side, initial encounter for closed fracture: Secondary | ICD-10-CM

## 2013-02-20 NOTE — Progress Notes (Signed)
Pre-visit discussion using our clinic review tool. No additional management support is needed unless otherwise documented below in the visit note.  

## 2013-02-20 NOTE — Progress Notes (Signed)
Date:  02/20/2013   Name:  Mathew Vazquez   DOB:  1975/04/23   MRN:  [id number] Gender: male Age: 38 y.o.  Primary Physician:  Hannah Beat, MD   Chief Complaint: Follow-up   Subjective:   History of Present Illness:  Mathew Vazquez is a 38 y.o. pleasant patient who presents with the following:  The patient sustained a injury while he was skiing in [place], and a snowboarder struck him. [place] patrol [place] and then [place] to the emergency room in [place]. Ultimately, they did a CT of his chest, abdomen, and pelvis, and found fractures of the 11th and 12th ribs. Date of injury was February 16, 2013. He has been taking some Tylenol and some Percocet occasionally at nighttime, but he is trying to minimize his narcotic use. He does have some significant pain, and he is only gone to work for a half day thus far.  Patient Active Problem List   Diagnosis Date Noted  . Alcohol abuse 04/06/2011  . OTHER MALAISE AND FATIGUE 03/15/2010  . Major depressive disorder 07/01/2008  . GERD 07/01/2008  . GASTRIC ULCER 07/01/2008  . ALLERGY 07/01/2008    Past Medical History  Diagnosis Date  . Allergy   . Depression   . Asthma   . GERD (gastroesophageal reflux disease)   . Gastric ulcer   . Other and unspecified hyperlipidemia 10/20/2010  . Hypogonadism male 10/25/2010    Past Surgical History  Procedure Laterality Date  . Tonsillectomy  1990    History   Social History  . Marital Status: Single    Spouse Name: N/A    Number of Children: N/A  . Years of Education: N/A   Occupational History  . insurance    Social History Main Topics  . Smoking status: Former Smoker    Quit date: 01/24/2001  . Smokeless tobacco: Not on file  . Alcohol Use: Yes     Comment: 6-7 drinks a night  . Drug Use: No  . Sexual Activity: Not on file   Other Topics Concern  . Not on file   Social History Narrative   Daily caffeine use 3-4 daily      No regular exercise    Family History  Problem Relation Age of Onset  . Depression      Family history  . Diabetes      1st degree relative  . Hyperlipidemia      Family history  . Hypertension      Family history  . Skin cancer      Family history    No Known Allergies  Medication list has been reviewed and updated.  Review of Systems:  GEN: No fevers, chills. Nontoxic. Primarily MSK c/o today. MSK: Detailed in the HPI GI: tolerating PO intake without difficulty Neuro: No numbness, parasthesias, or tingling associated. Otherwise the pertinent positives of the ROS are noted above.   Objective:   Physical Examination: BP 136/82  Pulse 90  Temp(Src) 98.1 F (36.7 C) (Oral)  Ht 5\' 8"  (1.727 m)  Wt 199 lb 12 oz (90.606 kg)  BMI 30.38 kg/m2  SpO2 96%  Ideal Body Weight: Weight in (lb) to have BMI = 25: 164.1   GEN: WDWN, NAD, Non-toxic, Alert & Oriented x 3 HEENT: Atraumatic, Normocephalic.  Ears and Nose: No external deformity. EXTR: No clubbing/cyanosis/edema NEURO: Normal gait.  PSYCH: Normally interactive. Conversant. Not depressed or anxious  appearing.  Calm demeanor.   He is notably tender on the posterior aspect of his ribs on the RIGHT side. Vigorous examination was not undertaken secondary to known fracture and visualized on bone windows of his CT. He is breathing comfortably in no distress.  CT of the chest, abdomen and pelvis. The radiological images were independently reviewed by myself in the office and results were reviewed with the patient. My independent interpretation of images:  Windows were reviewed by myself. Reviewed in the presence of the patient. There is evidence of 11th and 12th posterior RIGHT side rib fracture with minimal displacement. There is also some slight degree of scoliosis present. Otherwise, from a bone standpoint, there is no significant findings. Lung windows and abdomen were reviewed by the  primary reading radiologist, and that radiological report and scanned into the system. There was some evidence of fatty liver changes, but otherwise no pneumothorax, and otherwise unremarkable. 02/20/2013 Hannah BeatSpencer Anelis Hrivnak, MD   Assessment & Plan:    Multiple closed fractures of ribs of right side  I told him that he would likely take approximately 4 weeks to heal. Potentially longer than this. He will need to take it easy, and if he needed to have some mild analgesics during the day, then certainly Tylenol or ibuprofen would be appropriate. He will need to limit his activities. His work we'll let him work somewhat from home.  He may get an incentive spirometer, which I told him would be a reasonable idea, and may decrease potential chance of pneumonia. He is going to call me if he has any difficulties at all.  Patient Instructions  Incentive Spirometer   No orders of the defined types were placed in this encounter.    New medications, updates to list, dose adjustments: Meds ordered this encounter  Medications  . oxyCODONE-acetaminophen (PERCOCET/ROXICET) 5-325 MG per tablet    Sig:     Signed,  Jermarcus Mcfadyen T. Jeselle Hiser, MD, CAQ Sports Medicine  [facility] [address] [place] [address] Phone: [phone] Fax: [phone]    Medication List       This list is accurate as of: 02/20/13 11:59 PM.  Always use your most recent med list.               acetaZOLAMIDE 250 MG tablet  Commonly known as:  DIAMOX  Take 1 tablet (250 mg total) by mouth 3 (three) times daily.     diazepam 2 MG tablet  Commonly known as:  VALIUM  Take 1 tablet (2 mg total) by mouth every 8 (eight) hours as needed for anxiety.     oxyCODONE-acetaminophen 5-325 MG per tablet  Commonly known as:  PERCOCET/ROXICET

## 2013-02-20 NOTE — Patient Instructions (Signed)
Incentive Spirometer

## 2013-09-24 ENCOUNTER — Ambulatory Visit (INDEPENDENT_AMBULATORY_CARE_PROVIDER_SITE_OTHER): Payer: Managed Care, Other (non HMO) | Admitting: Family Medicine

## 2013-09-24 ENCOUNTER — Encounter: Payer: Self-pay | Admitting: Family Medicine

## 2013-09-24 VITALS — BP 114/90 | HR 87 | Temp 97.9°F | Ht 68.0 in | Wt 191.5 lb

## 2013-09-24 DIAGNOSIS — L255 Unspecified contact dermatitis due to plants, except food: Secondary | ICD-10-CM

## 2013-09-24 DIAGNOSIS — L247 Irritant contact dermatitis due to plants, except food: Secondary | ICD-10-CM | POA: Insufficient documentation

## 2013-09-24 MED ORDER — TRIAMCINOLONE ACETONIDE 0.5 % EX CREA: 1.0000 "application " | TOPICAL_CREAM | Freq: Two times a day (BID) | CUTANEOUS | Status: AC

## 2013-09-24 NOTE — Assessment & Plan Note (Signed)
Less likely staph skin infection. If not improving with steroids consider antibiotic ointment.

## 2013-09-24 NOTE — Patient Instructions (Signed)
Apply cream twice daily x 2 weeks. Call if redness spreading or not resolving in that time.

## 2013-09-24 NOTE — Progress Notes (Signed)
   Subjective:    Patient ID: Mathew Vazquez, male    DOB: 1975-07-20, 38 y.o.   MRN: [id number]  Rash Pertinent negatives include no fatigue, fever or shortness of breath.    38 year old male pt of Dr. Copland';s presents with new onset rash  On right posterior knee.  In early august had bug bites after camping trip, itchy.  Since then now red confluent rash on posterior knee. Scabs, yellowish blister initially, more dried now..  He has noted yellow crusty discharge.  Using topical benadryl no helping at all.   Review of Systems  Constitutional: Negative for fever and fatigue.  Respiratory: Negative for shortness of breath.   Gastrointestinal: Negative for abdominal pain.  Musculoskeletal: Negative for arthralgias.  Skin: Positive for rash.  Neurological: Negative for headaches.       Objective:   Physical Exam  Constitutional: He appears well-developed and well-nourished.  Eyes: Conjunctivae are normal. Pupils are equal, round, and reactive to light.  Neck: Normal range of motion. Neck supple.  Cardiovascular: Normal rate and regular rhythm.  Exam reveals no friction rub.   No murmur heard. Pulmonary/Chest: Effort normal and breath sounds normal.  Skin:  Linear vesicles on right arm, erythematous (not wram) plaque behind right knee, area of yellow crust vesicles. No pustules          Assessment & Plan:

## 2013-09-24 NOTE — Progress Notes (Signed)
Pre visit review using our clinic review tool, if applicable. No additional management support is needed unless otherwise documented below in the visit note. 

## 2013-12-09 ENCOUNTER — Other Ambulatory Visit: Payer: Self-pay | Admitting: Family Medicine

## 2013-12-09 DIAGNOSIS — Z79899 Other long term (current) drug therapy: Secondary | ICD-10-CM

## 2013-12-09 DIAGNOSIS — Z13220 Encounter for screening for lipoid disorders: Secondary | ICD-10-CM

## 2013-12-10 ENCOUNTER — Other Ambulatory Visit (INDEPENDENT_AMBULATORY_CARE_PROVIDER_SITE_OTHER): Payer: Managed Care, Other (non HMO)

## 2013-12-10 DIAGNOSIS — Z13220 Encounter for screening for lipoid disorders: Secondary | ICD-10-CM

## 2013-12-10 DIAGNOSIS — Z79899 Other long term (current) drug therapy: Secondary | ICD-10-CM

## 2013-12-10 LAB — CBC WITH DIFFERENTIAL/PLATELET
Basophils Absolute: 0 K/uL (ref 0.0–0.1)
Basophils Relative: 0.5 % (ref 0.0–3.0)
Eosinophils Absolute: 0.3 K/uL (ref 0.0–0.7)
Eosinophils Relative: 3.6 % (ref 0.0–5.0)
HCT: 48 % (ref 39.0–52.0)
Hemoglobin: 16.4 g/dL (ref 13.0–17.0)
Lymphocytes Relative: 29 % (ref 12.0–46.0)
Lymphs Abs: 2.4 K/uL (ref 0.7–4.0)
MCHC: 34.2 g/dL (ref 30.0–36.0)
MCV: 95 fl (ref 78.0–100.0)
Monocytes Absolute: 0.8 K/uL (ref 0.1–1.0)
Monocytes Relative: 9.4 % (ref 3.0–12.0)
Neutro Abs: 4.7 K/uL (ref 1.4–7.7)
Neutrophils Relative %: 57.5 % (ref 43.0–77.0)
Platelets: 244 K/uL (ref 150.0–400.0)
RBC: 5.05 Mil/uL (ref 4.22–5.81)
RDW: 12.7 % (ref 11.5–15.5)
WBC: 8.3 K/uL (ref 4.0–10.5)

## 2013-12-11 LAB — LIPID PANEL
CHOL/HDL RATIO: 4
Cholesterol: 284 mg/dL — ABNORMAL HIGH (ref 0–200)
HDL: 69.2 mg/dL (ref >39.00)
NONHDL: 214.8
TRIGLYCERIDES: 427 mg/dL — AB (ref 0.0–149.0)
VLDL: 85.4 mg/dL — AB (ref 0.0–40.0)

## 2013-12-11 LAB — HEPATIC FUNCTION PANEL
ALBUMIN: 4.5 g/dL (ref 3.5–5.2)
ALK PHOS: 70 U/L (ref 39–117)
ALT: 68 U/L — ABNORMAL HIGH (ref 0–53)
AST: 57 U/L — ABNORMAL HIGH (ref 0–37)
BILIRUBIN DIRECT: 0 mg/dL (ref 0.0–0.3)
Total Bilirubin: 0.4 mg/dL (ref 0.2–1.2)
Total Protein: 7.9 g/dL (ref 6.0–8.3)

## 2013-12-11 LAB — BASIC METABOLIC PANEL
BUN: 14 mg/dL (ref 6–23)
CHLORIDE: 103 meq/L (ref 96–112)
CO2: 27 mEq/L (ref 19–32)
Calcium: 9.9 mg/dL (ref 8.4–10.5)
Creatinine, Ser: 1.2 mg/dL (ref 0.4–1.5)
GFR: 72.31 mL/min (ref >60.00)
GLUCOSE: 109 mg/dL — AB (ref 70–99)
Potassium: 4 mEq/L (ref 3.5–5.1)
Sodium: 138 mEq/L (ref 135–145)

## 2013-12-11 LAB — LDL CHOLESTEROL, DIRECT: LDL DIRECT: 164.5 mg/dL

## 2013-12-16 ENCOUNTER — Encounter: Payer: Managed Care, Other (non HMO) | Admitting: Family Medicine

## 2013-12-17 ENCOUNTER — Encounter: Payer: Self-pay | Admitting: Family Medicine

## 2013-12-17 ENCOUNTER — Ambulatory Visit (INDEPENDENT_AMBULATORY_CARE_PROVIDER_SITE_OTHER): Payer: Managed Care, Other (non HMO) | Admitting: Family Medicine

## 2013-12-17 VITALS — BP 118/82 | HR 88 | Temp 98.1°F | Ht 68.0 in | Wt 195.5 lb

## 2013-12-17 DIAGNOSIS — Z0000 Encounter for general adult medical examination without abnormal findings: Secondary | ICD-10-CM

## 2013-12-17 DIAGNOSIS — R7989 Other specified abnormal findings of blood chemistry: Secondary | ICD-10-CM

## 2013-12-17 DIAGNOSIS — E785 Hyperlipidemia, unspecified: Secondary | ICD-10-CM

## 2013-12-17 DIAGNOSIS — R945 Abnormal results of liver function studies: Secondary | ICD-10-CM

## 2013-12-17 HISTORY — DX: Hyperlipidemia, unspecified: E78.5

## 2013-12-17 MED ORDER — ATORVASTATIN CALCIUM 20 MG PO TABS: 20.0000 mg | ORAL_TABLET | Freq: Every day | ORAL | Status: AC

## 2013-12-17 NOTE — Progress Notes (Signed)
Pre visit review using our clinic review tool, if applicable. No additional management support is needed unless otherwise documented below in the visit note. 

## 2013-12-17 NOTE — Progress Notes (Signed)
Dr. Frederico Hamman T. Gianny Sabino, MD, [facility] [place], [address] Phone: [phone] Fax: [phone]  12/17/2013  Patient: Mathew Vazquez, MRN: [id number], DOB: 10-13-1975, 38 y.o.  Primary Physician:  Owens Loffler, MD  Chief Complaint: Annual Exam  Subjective:   Mathew Vazquez is a 38 y.o. pleasant patient who presents with the following:  Preventative Health Maintenance Visit:  Health Maintenance Summary Reviewed and updated, unless pt declines services.  Tobacco History Reviewed. Alcohol: 6-12 beers daily Exercise Habits: Some activity, rec at least 30 mins 5 times a week STD concerns: no risk or activity to increase risk Drug Use: None Encouraged self-testicular check  Health Maintenance  Topic Date Due  . TETANUS/TDAP  09/14/1995  . INFLUENZA VACCINE  08/24/2013   Lipids: Unstable, not on meds. Panel reviewed with patient.  Lipids:    Component Value Date/Time   CHOL 284* 12/10/2013 0839   TRIG 427.0* 12/10/2013 0839   HDL 69.20 12/10/2013 0839   LDLDIRECT 164.5 12/10/2013 0839   VLDL 85.4* 12/10/2013 0839   CHOLHDL 4 12/10/2013 0839    Lab Results  Component Value Date   ALT 68* 12/10/2013   AST 57* 12/10/2013   ALKPHOS 70 12/10/2013   BILITOT 0.4 12/10/2013    Transferring to new position.   Weight gain.  Not getting exercise.  Doing some more cooking at home.   Elevated LFT's ETOH  Hepatic Function Latest Ref Rng 12/10/2013 08/13/2012 08/10/2011  Total Protein 6.0 - 8.3 g/dL 7.9 7.6 7.4  Albumin 3.5 - 5.2 g/dL 4.5 4.1 4.2  AST 0 - 37 U/L 57(H) 37 33  ALT 0 - 53 U/L 68(H) 54(H) 35  Alk Phosphatase 39 - 117 U/L 70 58 57  Total Bilirubin 0.2 - 1.2 mg/dL 0.4 0.4 0.6  Bilirubin, Direct 0.0 - 0.3 mg/dL 0.0 0.0 0.0   His liver function tests have also been trending higher, and he has had an increase in his consumption of alcohol. He is been having a least 6 beers daily,  upwards to 12 or more sometimes.  He also has had some fatty liver in the past. He also has had some weight gain, and has decreased his exercise intake.   There is no immunization history on file for this patient. Patient Active Problem List   Diagnosis Date Noted  . Hyperlipidemia LDL goal <100 12/17/2013    Priority: Medium  . Elevated LFTs 12/17/2013    Priority: Medium  . Alcohol abuse 04/06/2011    Priority: Medium  . Major depressive disorder 07/01/2008  . GERD 07/01/2008  . GASTRIC ULCER 07/01/2008  . ALLERGY 07/01/2008   Past Medical History  Diagnosis Date  . Depression   . Asthma   . GERD (gastroesophageal reflux disease)   . Gastric ulcer   . Hyperlipidemia LDL goal <100 12/17/2013  . Alcohol abuse    Past Surgical History  Procedure Laterality Date  . Tonsillectomy  1990   History   Social History  . Marital Status: Single    Spouse Name: N/A    Number of Children: N/A  . Years of Education: N/A   Occupational History  . insurance    Social History Main Topics  . Smoking status: Former Smoker    Quit date: 01/24/2001  . Smokeless tobacco: Never Used  . Alcohol Use: 25.2 oz/week    42 Cans of beer per week     Comment: 6-12 drinks a night  .  Drug Use: No  . Sexual Activity: Not on file   Other Topics Concern  . Not on file   Social History Narrative   Daily caffeine use 3-4 daily   No regular exercise   6-12 beers daily   Single   Family History  Problem Relation Age of Onset  . Depression      Family history  . Diabetes      1st degree relative  . Hyperlipidemia      Family history  . Hypertension      Family history  . Skin cancer      Family history   No Known Allergies  Medication list has been reviewed and updated.   General: Denies fever, chills, sweats. No significant weight loss. Eyes: Denies blurring,significant itching ENT: Denies earache, sore throat, and hoarseness. Cardiovascular: Denies chest pains,  palpitations, dyspnea on exertion Respiratory: Denies cough, dyspnea at rest,wheeezing Breast: no concerns about lumps GI: Denies nausea, vomiting, diarrhea, constipation, change in bowel habits, abdominal pain, melena, hematochezia GU: Denies penile discharge, ED, urinary flow / outflow problems. No STD concerns. Musculoskeletal: Denies back pain, joint pain Derm: Denies rash, itching Neuro: Denies  paresthesias, frequent falls, frequent headaches Psych: Denies depression, anxiety Endocrine: Denies cold intolerance, heat intolerance, polydipsia Heme: Denies enlarged lymph nodes Allergy: No hayfever  Objective:   BP 118/82 mmHg  Pulse 88  Temp(Src) 98.1 F (36.7 C) (Oral)  Ht 5' 8"  (1.727 m)  Wt 195 lb 8 oz (88.678 kg)  BMI 29.73 kg/m2  SpO2 97% Ideal Body Weight: Weight in (lb) to have BMI = 25: 164.1  No exam data present  GEN: well developed, well nourished, no acute distress Eyes: conjunctiva and lids normal, PERRLA, EOMI ENT: TM clear, nares clear, oral exam WNL Neck: supple, no lymphadenopathy, no thyromegaly, no JVD Pulm: clear to auscultation and percussion, respiratory effort normal CV: regular rate and rhythm, S1-S2, no murmur, rub or gallop, no bruits, peripheral pulses normal and symmetric, no cyanosis, clubbing, edema or varicosities GI: soft, non-tender; no hepatosplenomegaly, masses; active bowel sounds all quadrants GU: no hernia, testicular mass, penile discharge Lymph: no cervical, axillary or inguinal adenopathy MSK: gait normal, muscle tone and strength WNL, no joint swelling, effusions, discoloration, crepitus  SKIN: clear, good turgor, color WNL, no rashes, lesions, or ulcerations Neuro: normal mental status, normal strength, sensation, and motion Psych: alert; oriented to person, place and time, normally interactive and not anxious or depressed in appearance. All labs reviewed with patient.  Lipids:    Component Value Date/Time   CHOL 284*  12/10/2013 0839   TRIG 427.0* 12/10/2013 0839   HDL 69.20 12/10/2013 0839   LDLDIRECT 164.5 12/10/2013 0839   VLDL 85.4* 12/10/2013 0839   CHOLHDL 4 12/10/2013 0839   CBC: CBC Latest Ref Rng 12/10/2013 08/13/2012 04/06/2011  WBC 4.0 - 10.5 K/uL 8.3 5.8 7.4  Hemoglobin 13.0 - 17.0 g/dL 16.4 15.8 15.5  Hematocrit 39.0 - 52.0 % 48.0 45.5 45.7  Platelets 150.0 - 400.0 K/uL 244.0 224.0 867.6    Basic Metabolic Panel:    Component Value Date/Time   NA 138 12/10/2013 0839   K 4.0 12/10/2013 0839   CL 103 12/10/2013 0839   CO2 27 12/10/2013 0839   BUN 14 12/10/2013 0839   CREATININE 1.2 12/10/2013 0839   GLUCOSE 109* 12/10/2013 0839   CALCIUM 9.9 12/10/2013 0839   Hepatic Function Latest Ref Rng 12/10/2013 08/13/2012 08/10/2011  Total Protein 6.0 - 8.3 g/dL 7.9 7.6 7.4  Albumin 3.5 - 5.2 g/dL 4.5 4.1 4.2  AST 0 - 37 U/L 57(H) 37 33  ALT 0 - 53 U/L 68(H) 54(H) 35  Alk Phosphatase 39 - 117 U/L 70 58 57  Total Bilirubin 0.2 - 1.2 mg/dL 0.4 0.4 0.6  Bilirubin, Direct 0.0 - 0.3 mg/dL 0.0 0.0 0.0    Lab Results  Component Value Date   TSH 1.06 08/13/2012   No results found for: PSA  Assessment and Plan:   Healthcare maintenance  Hyperlipidemia LDL goal <100: all of his cholesterol levels are quite high, his triglycerides are high, and I think that is going to be difficult for this gentleman to get them under control without medication. We are going to start Lipitor and recheck in 6 months.  Elevated LFTs, have a concern that this may all be secondary to alcohol. We had a frank open discussion about this, and I recommended that he decrease or stop drinking alcohol entirely.  Health Maintenance Exam: The patient's preventative maintenance and recommended screening tests for an annual wellness exam were reviewed in full today. Brought up to date unless services declined.  Counselled on the importance of diet, exercise, and its role in overall health and mortality. The patient's FH  and SH was reviewed, including their home life, tobacco status, and drug and alcohol status.  Follow-up: Return in about 6 months (around 06/17/2014). Unless noted, follow-up in 1 year for Health Maintenance Exam.  New Prescriptions   ATORVASTATIN (LIPITOR) 20 MG TABLET    Take 1 tablet (20 mg total) by mouth daily.   No orders of the defined types were placed in this encounter.    Signed,  Maud Deed. Matvey Llanas, MD   Patient's Medications  New Prescriptions   ATORVASTATIN (LIPITOR) 20 MG TABLET    Take 1 tablet (20 mg total) by mouth daily.  Previous Medications   CALCIUM CARBONATE (TUMS - DOSED IN MG ELEMENTAL CALCIUM) 500 MG CHEWABLE TABLET    Chew 1 tablet by mouth daily as needed for indigestion or heartburn.   TRIAMCINOLONE CREAM (KENALOG) 0.5 %    Apply 1 application topically 2 (two) times daily.  Modified Medications   No medications on file  Discontinued Medications   No medications on file

## 2013-12-18 ENCOUNTER — Other Ambulatory Visit: Payer: Self-pay | Admitting: Family Medicine

## 2013-12-18 NOTE — Telephone Encounter (Signed)
Pt forgot to mention that he needed the medication that he has taken before that had multiple purposes.  It was given to treat acute mountain sickness but it has been discussed that it may treat alcohol dependency.  He would like for this to be called in.  [facility] [address] [facility] [place]

## 2013-12-19 NOTE — Telephone Encounter (Signed)
I am pretty sure that he means acetazolamide / diamox, but i want to be very clear before I send in prescription.  This is the medicine typically used for mountain sickness.

## 2013-12-23 MED ORDER — MECLIZINE HCL 25 MG PO TABS: 25.0000 mg | ORAL_TABLET | Freq: Three times a day (TID) | ORAL | Status: AC | PRN

## 2013-12-23 NOTE — Telephone Encounter (Addendum)
Spoke with Mathew Vazquez.  He states he thinks it is the meclizine he is wanting.  Ok to send in refill?

## 2013-12-23 NOTE — Telephone Encounter (Signed)
Left message for Chris to return my call

## 2014-01-22 ENCOUNTER — Telehealth: Payer: Self-pay

## 2014-01-22 NOTE — Telephone Encounter (Signed)
I think that this is reasonable idea.  Start Naltrexone 50 mg, 1 po daily, #30, 1 refill  I want to have him follow-up with me in 4 weeks

## 2014-01-22 NOTE — Telephone Encounter (Signed)
Pt left v/m; pt was seen 12/17/13 and discussed concerns about possibly starting a new med; pt has done research on med and would like to try taking Naltrexone; trade name Revia or Depade. Pt request cb. [facility]

## 2014-01-23 MED ORDER — NALTREXONE HCL 50 MG PO TABS: 50.0000 mg | ORAL_TABLET | Freq: Every day | ORAL | Status: DC

## 2014-01-23 NOTE — Telephone Encounter (Signed)
I called, and left detailed message on voicemail. Rx sent electronically to [facility].

## 2014-01-23 NOTE — Telephone Encounter (Signed)
Pt called back appointment 02/26/14 @ 2:45

## 2014-02-26 ENCOUNTER — Ambulatory Visit (INDEPENDENT_AMBULATORY_CARE_PROVIDER_SITE_OTHER): Payer: 59 | Admitting: Family Medicine

## 2014-02-26 ENCOUNTER — Encounter: Payer: Self-pay | Admitting: Family Medicine

## 2014-02-26 VITALS — BP 110/78 | HR 85 | Temp 97.4°F | Ht 68.0 in | Wt 191.5 lb

## 2014-02-26 DIAGNOSIS — R7989 Other specified abnormal findings of blood chemistry: Secondary | ICD-10-CM

## 2014-02-26 DIAGNOSIS — R945 Abnormal results of liver function studies: Principal | ICD-10-CM

## 2014-02-26 DIAGNOSIS — F1010 Alcohol abuse, uncomplicated: Secondary | ICD-10-CM

## 2014-02-26 NOTE — Progress Notes (Signed)
Pre visit review using our clinic review tool, if applicable. No additional management support is needed unless otherwise documented below in the visit note. 

## 2014-02-26 NOTE — Progress Notes (Signed)
Dr. Karleen HampshireSpencer T. Candid Bovey, MD, [facility] [address] [place], [id number] Phone: [phone] Fax: [phone]  02/26/2014  Patient: Mathew Vazquez, MRN: [id number], DOB: 1975/09/11, 39 y.o.  Primary Physician:  Hannah BeatSpencer Jarrell Armond, MD  Chief Complaint: Follow-up  Subjective:   Mathew Vazquez is a 39 y.o. very pleasant male patient who presents with the following:  F/u alcohol and LFT's.   Past Medical History, Surgical History, Social History, Family History, Problem List, Medications, and Allergies have been reviewed and updated if relevant.   GEN: No acute illnesses, no fevers, chills. GI: No n/v/d, eating normally Pulm: No SOB Interactive and getting along well at home.  Otherwise, ROS is as per the HPI.  Objective:   BP 110/78 mmHg  Pulse 85  Temp(Src) 97.4 F (36.3 C) (Oral)  Ht 5\' 8"  (1.727 m)  Wt 191 lb 8 oz (86.864 kg)  BMI 29.12 kg/m2  GEN: WDWN, NAD, Non-toxic, A & O x 3 HEENT: Atraumatic, Normocephalic. Neck supple. No masses, No LAD. Ears and Nose: No external deformity. CV: RRR, No M/G/R. No JVD. No thrill. No extra heart sounds. PULM: CTA B, no wheezes, crackles, rhonchi. No retractions. No resp. distress. No accessory muscle use. EXTR: No c/c/e NEURO Normal gait.  PSYCH: Normally interactive. Conversant. Not depressed or anxious appearing.  Calm demeanor.   Laboratory and Imaging Data: Results for orders placed or performed in visit on 12/10/13  Lipid panel  Result Value Ref Range   Cholesterol 284 (H) 0 - 200 mg/dL   Triglycerides 295.2427.0 (H) 0.0 - 149.0 mg/dL   HDL 84.1369.20 >24.40>39.00 mg/dL   VLDL 10.285.4 (H) 0.0 - 72.540.0 mg/dL   Total CHOL/HDL Ratio 4    NonHDL 214.80   Hepatic function panel  Result Value Ref Range   Total Bilirubin 0.4 0.2 - 1.2 mg/dL   Bilirubin, Direct 0.0 0.0 - 0.3 mg/dL   Alkaline Phosphatase 70 39 - 117 U/L   AST 57 (H) 0 - 37 U/L   ALT 68 (H) 0 - 53 U/L   Total Protein  7.9 6.0 - 8.3 g/dL   Albumin 4.5 3.5 - 5.2 g/dL  CBC with Differential  Result Value Ref Range   WBC 8.3 4.0 - 10.5 K/uL   RBC 5.05 4.22 - 5.81 Mil/uL   Hemoglobin 16.4 13.0 - 17.0 g/dL   HCT 36.648.0 44.039.0 - 34.752.0 %   MCV 95.0 78.0 - 100.0 fl   MCHC 34.2 30.0 - 36.0 g/dL   RDW 42.512.7 95.611.5 - 38.715.5 %   Platelets 244.0 150.0 - 400.0 K/uL   Neutrophils Relative % 57.5 43.0 - 77.0 %   Lymphocytes Relative 29.0 12.0 - 46.0 %   Monocytes Relative 9.4 3.0 - 12.0 %   Eosinophils Relative 3.6 0.0 - 5.0 %   Basophils Relative 0.5 0.0 - 3.0 %   Neutro Abs 4.7 1.4 - 7.7 K/uL   Lymphs Abs 2.4 0.7 - 4.0 K/uL   Monocytes Absolute 0.8 0.1 - 1.0 K/uL   Eosinophils Absolute 0.3 0.0 - 0.7 K/uL   Basophils Absolute 0.0 0.0 - 0.1 K/uL  Basic metabolic panel  Result Value Ref Range   Sodium 138 135 - 145 mEq/L   Potassium 4.0 3.5 - 5.1 mEq/L   Chloride 103 96 - 112 mEq/L   CO2 27 19 - 32 mEq/L   Glucose, Bld 109 (H) 70 - 99 mg/dL   BUN 14 6 - 23 mg/dL  Creatinine, Ser 1.2 0.4 - 1.5 mg/dL   Calcium 9.9 8.4 - 16.1 mg/dL   GFR 09.60 >45.40 mL/min  LDL cholesterol, direct  Result Value Ref Range   Direct LDL 164.5 mg/dL     Assessment and Plan:   Elevated LFTs - Plan: Hepatic function panel, Basic metabolic panel  Alcohol abuse - Plan: Basic metabolic panel  >15 minutes spent in face to face time with patient, >50% spent in counselling or coordination of care: the patient recently started on Naltrexone therapy for alcohol addiction. He reports being fairly compliant with this, with only one or 2 slip-ups. He is dramatically cut back his drinking alcohol. House fairly frank with the patient, and told him that I felt that he is drinking and alcoholic manner currently. I recommended that he discontinue drinking alcohol indefinitely. Also recommended that he go to Alcoholics Anonymous and get a sponsor. At this point, he is not ready to do this. He would like to discontinue or decrease his drinking  significantly, but there will be some occasions such as the Super Bowl this upcoming weekend where he thinks that he will likely drink. He is trying to be very conscious of this.  LFT's  Likely elevated from ETOH - recheck  New Prescriptions   No medications on file   Orders Placed This Encounter  Procedures  . Hepatic function panel  . Basic metabolic panel    Signed,  Karleen Hampshire T. Montario Zilka, MD   Patient's Medications  New Prescriptions   No medications on file  Previous Medications   ATORVASTATIN (LIPITOR) 20 MG TABLET    Take 1 tablet (20 mg total) by mouth daily.   CALCIUM CARBONATE (TUMS - DOSED IN MG ELEMENTAL CALCIUM) 500 MG CHEWABLE TABLET    Chew 1 tablet by mouth daily as needed for indigestion or heartburn.   MECLIZINE (ANTIVERT) 25 MG TABLET    Take 1 tablet (25 mg total) by mouth 3 (three) times daily as needed for dizziness.   NALTREXONE (DEPADE) 50 MG TABLET    Take 1 tablet (50 mg total) by mouth daily.   TRIAMCINOLONE CREAM (KENALOG) 0.5 %    Apply 1 application topically 2 (two) times daily.  Modified Medications   No medications on file  Discontinued Medications   No medications on file

## 2014-02-27 ENCOUNTER — Encounter: Payer: Self-pay | Admitting: *Deleted

## 2014-02-27 LAB — BASIC METABOLIC PANEL
BUN: 10 mg/dL (ref 6–23)
CALCIUM: 9.9 mg/dL (ref 8.4–10.5)
CO2: 22 mEq/L (ref 19–32)
CREATININE: 1.29 mg/dL (ref 0.40–1.50)
Chloride: 102 mEq/L (ref 96–112)
GFR: 66.45 mL/min (ref >60.00)
Glucose, Bld: 96 mg/dL (ref 70–99)
Potassium: 3.6 mEq/L (ref 3.5–5.1)
SODIUM: 138 meq/L (ref 135–145)

## 2014-02-27 LAB — HEPATIC FUNCTION PANEL
ALBUMIN: 4.7 g/dL (ref 3.5–5.2)
ALT: 42 U/L (ref 0–53)
AST: 30 U/L (ref 0–37)
Alkaline Phosphatase: 68 U/L (ref 39–117)
BILIRUBIN DIRECT: 0.1 mg/dL (ref 0.0–0.3)
Total Bilirubin: 0.7 mg/dL (ref 0.2–1.2)
Total Protein: 7.9 g/dL (ref 6.0–8.3)

## 2014-05-07 ENCOUNTER — Other Ambulatory Visit: Payer: Self-pay

## 2014-05-07 MED ORDER — DIAZEPAM 2 MG PO TABS: 2.0000 mg | ORAL_TABLET | Freq: Three times a day (TID) | ORAL | Status: AC | PRN

## 2014-05-07 NOTE — Telephone Encounter (Signed)
Ok to refill #30, 0 refills 

## 2014-05-07 NOTE — Telephone Encounter (Signed)
[facility] in [place] [phone] called to verify [facility]iazep[facility] was to [facility] that [facility]. Explained that was request of pt.

## 2014-05-07 NOTE — Telephone Encounter (Signed)
Pt left v/m requesting refill on diazepam; pt only uses for anxiety when flying; last printed rx for diazepam 08/01/2012. [facility] [address], [place] (could not find on pharmacy list). Pt last seen for f/u on 02/26/2014.Please advise.

## 2014-05-07 NOTE — Telephone Encounter (Signed)
[place] [facility] [place] [phone].

## 2014-06-11 ENCOUNTER — Other Ambulatory Visit: Payer: 59

## 2014-06-12 ENCOUNTER — Other Ambulatory Visit (INDEPENDENT_AMBULATORY_CARE_PROVIDER_SITE_OTHER): Payer: 59

## 2014-06-12 DIAGNOSIS — E785 Hyperlipidemia, unspecified: Secondary | ICD-10-CM | POA: Diagnosis not present

## 2014-06-12 DIAGNOSIS — Z79899 Other long term (current) drug therapy: Secondary | ICD-10-CM | POA: Diagnosis not present

## 2014-06-12 LAB — LIPID PANEL
CHOLESTEROL: 240 mg/dL — AB (ref 0–200)
HDL: 62.1 mg/dL (ref >39.00)
NonHDL: 177.9
Total CHOL/HDL Ratio: 4
Triglycerides: 287 mg/dL — ABNORMAL HIGH (ref 0.0–149.0)
VLDL: 57.4 mg/dL — ABNORMAL HIGH (ref 0.0–40.0)

## 2014-06-12 LAB — HEPATIC FUNCTION PANEL
ALT: 37 U/L (ref 0–53)
AST: 35 U/L (ref 0–37)
Albumin: 4.2 g/dL (ref 3.5–5.2)
Alkaline Phosphatase: 63 U/L (ref 39–117)
Bilirubin, Direct: 0.1 mg/dL (ref 0.0–0.3)
TOTAL PROTEIN: 7.4 g/dL (ref 6.0–8.3)
Total Bilirubin: 0.5 mg/dL (ref 0.2–1.2)

## 2014-06-12 LAB — CBC WITH DIFFERENTIAL/PLATELET
BASOS ABS: 0 10*3/uL (ref 0.0–0.1)
BASOS PCT: 0.7 % (ref 0.0–3.0)
EOS ABS: 0.4 10*3/uL (ref 0.0–0.7)
Eosinophils Relative: 5.6 % — ABNORMAL HIGH (ref 0.0–5.0)
HCT: 44 % (ref 39.0–52.0)
Hemoglobin: 15.4 g/dL (ref 13.0–17.0)
LYMPHS PCT: 23.7 % (ref 12.0–46.0)
Lymphs Abs: 1.6 10*3/uL (ref 0.7–4.0)
MCHC: 35 g/dL (ref 30.0–36.0)
MCV: 92.1 fl (ref 78.0–100.0)
MONO ABS: 0.5 10*3/uL (ref 0.1–1.0)
MONOS PCT: 7.7 % (ref 3.0–12.0)
Neutro Abs: 4.2 10*3/uL (ref 1.4–7.7)
Neutrophils Relative %: 62.3 % (ref 43.0–77.0)
Platelets: 220 10*3/uL (ref 150.0–400.0)
RBC: 4.77 Mil/uL (ref 4.22–5.81)
RDW: 13.7 % (ref 11.5–15.5)
WBC: 6.7 10*3/uL (ref 4.0–10.5)

## 2014-06-12 LAB — BASIC METABOLIC PANEL
BUN: 12 mg/dL (ref 6–23)
CALCIUM: 9.5 mg/dL (ref 8.4–10.5)
CHLORIDE: 103 meq/L (ref 96–112)
CO2: 29 meq/L (ref 19–32)
Creatinine, Ser: 1.1 mg/dL (ref 0.40–1.50)
GFR: 79.73 mL/min (ref >60.00)
Glucose, Bld: 103 mg/dL — ABNORMAL HIGH (ref 70–99)
Potassium: 4 mEq/L (ref 3.5–5.1)
SODIUM: 137 meq/L (ref 135–145)

## 2014-06-12 LAB — LDL CHOLESTEROL, DIRECT: Direct LDL: 124 mg/dL

## 2014-06-18 ENCOUNTER — Ambulatory Visit (INDEPENDENT_AMBULATORY_CARE_PROVIDER_SITE_OTHER): Payer: 59 | Admitting: Family Medicine

## 2014-06-18 ENCOUNTER — Encounter: Payer: Self-pay | Admitting: Family Medicine

## 2014-06-18 VITALS — BP 130/90 | HR 100 | Temp 97.5°F | Ht 67.5 in | Wt 188.5 lb

## 2014-06-18 DIAGNOSIS — Z0000 Encounter for general adult medical examination without abnormal findings: Secondary | ICD-10-CM | POA: Diagnosis not present

## 2014-06-18 MED ORDER — TADALAFIL 20 MG PO TABS: 10.0000 mg | ORAL_TABLET | ORAL | Status: DC | PRN

## 2014-06-18 NOTE — Progress Notes (Signed)
Dr. Frederico Hamman T. Jaevon Paras, MD, [facility] Medicine [place], [address] Phone: [phone] Fax: [phone]  06/18/2014  Patient: Mathew Vazquez, MRN: [id number], DOB: 26-Jun-1975, 39 y.o.  Primary Physician:  Owens Loffler, MD  Chief Complaint: Annual Exam  Subjective:   Mathew Vazquez is a 39 y.o. pleasant patient who presents with the following:  Preventative Health Maintenance Visit:  Health Maintenance Summary Reviewed and updated, unless pt declines services.  Tobacco History Reviewed. Alcohol: 5 days a week, 4-5 a day, >> on the weekend Exercise Habits: Some activity, rec at least 30 mins 5 times a week. Hiking. Biking.  STD concerns: no risk or activity to increase risk Drug Use: None Encouraged self-testicular check  Health Maintenance  Topic Date Due  . HIV Screening  09/14/1991  . TETANUS/TDAP  09/14/1995  . INFLUENZA VACCINE  02/27/2015 (Originally 08/25/2014)    There is no immunization history on file for this patient. Patient Active Problem List   Diagnosis Date Noted  . Hyperlipidemia LDL goal <100 12/17/2013    Priority: Medium  . Elevated LFTs 12/17/2013    Priority: Medium  . Alcohol abuse 04/06/2011    Priority: Medium  . Major depressive disorder 07/01/2008  . GERD 07/01/2008  . GASTRIC ULCER 07/01/2008  . ALLERGY 07/01/2008   Past Medical History  Diagnosis Date  . Depression   . Asthma   . GERD (gastroesophageal reflux disease)   . Gastric ulcer   . Hyperlipidemia LDL goal <100 12/17/2013  . Alcohol abuse    Past Surgical History  Procedure Laterality Date  . Tonsillectomy  1990   History   Social History  . Marital Status: Single    Spouse Name: N/A  . Number of Children: N/A  . Years of Education: N/A   Occupational History  . insurance    Social History Main Topics  . Smoking status: Former Smoker    Quit date: 01/24/2001  . Smokeless tobacco: Never Used    . Alcohol Use: 25.2 oz/week    42 Cans of beer per week     Comment: 6-12 drinks a night  . Drug Use: No  . Sexual Activity: Not on file   Other Topics Concern  . Not on file   Social History Narrative   Daily caffeine use 3-4 daily   No regular exercise   6-12 beers daily   Single   Family History  Problem Relation Age of Onset  . Depression      Family history  . Diabetes      1st degree relative  . Hyperlipidemia      Family history  . Hypertension      Family history  . Skin cancer      Family history   No Known Allergies  Medication list has been reviewed and updated.   General: Denies fever, chills, sweats. No significant weight loss. Eyes: Denies blurring,significant itching ENT: Denies earache, sore throat, and hoarseness. Cardiovascular: Denies chest pains, palpitations, dyspnea on exertion Respiratory: Denies cough, dyspnea at rest,wheeezing Breast: no concerns about lumps GI: Denies nausea, vomiting, diarrhea, constipation, change in bowel habits, abdominal pain, melena, hematochezia GU: Denies penile discharge, ED, urinary flow / outflow problems. No STD concerns. Musculoskeletal: Denies back pain, joint pain Derm: Denies rash, itching Neuro: Denies  paresthesias, frequent falls, frequent headaches Psych: Denies depression, anxiety Endocrine: Denies cold intolerance, heat intolerance, polydipsia Heme: Denies enlarged lymph nodes Allergy: No hayfever  Objective:  BP 130/90 mmHg  Pulse 100  Temp(Src) 97.5 F (36.4 C) (Oral)  Ht 5' 7.5" (1.715 m)  Wt 188 lb 8 oz (85.503 kg)  BMI 29.07 kg/m2 Ideal Body Weight: Weight in (lb) to have BMI = 25: 161.7  No exam data present  GEN: well developed, well nourished, no acute distress Eyes: conjunctiva and lids normal, PERRLA, EOMI ENT: TM clear, nares clear, oral exam WNL Neck: supple, no lymphadenopathy, no thyromegaly, no JVD Pulm: clear to auscultation and percussion, respiratory effort  normal CV: regular rate and rhythm, S1-S2, no murmur, rub or gallop, no bruits, peripheral pulses normal and symmetric, no cyanosis, clubbing, edema or varicosities GI: soft, non-tender; no hepatosplenomegaly, masses; active bowel sounds all quadrants GU: no hernia, testicular mass, penile discharge Lymph: no cervical, axillary or inguinal adenopathy MSK: gait normal, muscle tone and strength WNL, no joint swelling, effusions, discoloration, crepitus  SKIN: clear, good turgor, color WNL, no rashes, lesions, or ulcerations Neuro: normal mental status, normal strength, sensation, and motion Psych: alert; oriented to person, place and time, normally interactive and not anxious or depressed in appearance. All labs reviewed with patient.  Lipids:    Component Value Date/Time   CHOL 240* 06/12/2014 1041   TRIG 287.0* 06/12/2014 1041   HDL 62.10 06/12/2014 1041   LDLDIRECT 124.0 06/12/2014 1041   VLDL 57.4* 06/12/2014 1041   CHOLHDL 4 06/12/2014 1041   CBC: CBC Latest Ref Rng 06/12/2014 12/10/2013 08/13/2012  WBC 4.0 - 10.5 K/uL 6.7 8.3 5.8  Hemoglobin 13.0 - 17.0 g/dL 15.4 16.4 15.8  Hematocrit 39.0 - 52.0 % 44.0 48.0 45.5  Platelets 150.0 - 400.0 K/uL 220.0 244.0 161.0    Basic Metabolic Panel:    Component Value Date/Time   NA 137 06/12/2014 1041   K 4.0 06/12/2014 1041   CL 103 06/12/2014 1041   CO2 29 06/12/2014 1041   BUN 12 06/12/2014 1041   CREATININE 1.10 06/12/2014 1041   GLUCOSE 103* 06/12/2014 1041   CALCIUM 9.5 06/12/2014 1041   Hepatic Function Latest Ref Rng 06/12/2014 02/26/2014 12/10/2013  Total Protein 6.0 - 8.3 g/dL 7.4 7.9 7.9  Albumin 3.5 - 5.2 g/dL 4.2 4.7 4.5  AST 0 - 37 U/L 35 30 57(H)  ALT 0 - 53 U/L 37 42 68(H)  Alk Phosphatase 39 - 117 U/L 63 68 70  Total Bilirubin 0.2 - 1.2 mg/dL 0.5 0.7 0.4  Bilirubin, Direct 0.0 - 0.3 mg/dL 0.1 0.1 0.0    Lab Results  Component Value Date   TSH 1.06 08/13/2012   No results found for: PSA  Assessment and Plan:    Healthcare maintenance  Health Maintenance Exam: The patient's preventative maintenance and recommended screening tests for an annual wellness exam were reviewed in full today. Brought up to date unless services declined.  Counselled on the importance of diet, exercise, and its role in overall health and mortality. The patient's FH and SH was reviewed, including their home life, tobacco status, and drug and alcohol status.  Restarted drinking  Follow-up: No Follow-up on file. Unless noted, follow-up in 1 year for Health Maintenance Exam.  New Prescriptions   TADALAFIL (CIALIS) 20 MG TABLET    Take 0.5-1 tablets (10-20 mg total) by mouth every other day as needed for erectile dysfunction.   No orders of the defined types were placed in this encounter.    Signed,  Maud Deed. Margarett Viti, MD   Patient's Medications  New Prescriptions   TADALAFIL (CIALIS) 20 MG TABLET  Take 0.5-1 tablets (10-20 mg total) by mouth every other day as needed for erectile dysfunction.  Previous Medications   ATORVASTATIN (LIPITOR) 20 MG TABLET    Take 1 tablet (20 mg total) by mouth daily.   CALCIUM CARBONATE (TUMS - DOSED IN MG ELEMENTAL CALCIUM) 500 MG CHEWABLE TABLET    Chew 1 tablet by mouth daily as needed for indigestion or heartburn.   DIAZEPAM (VALIUM) 2 MG TABLET    Take 1 tablet (2 mg total) by mouth every 8 (eight) hours as needed for anxiety.   MECLIZINE (ANTIVERT) 25 MG TABLET    Take 1 tablet (25 mg total) by mouth 3 (three) times daily as needed for dizziness.   NALTREXONE (DEPADE) 50 MG TABLET    Take 1 tablet (50 mg total) by mouth daily.   TRIAMCINOLONE CREAM (KENALOG) 0.5 %    Apply 1 application topically 2 (two) times daily.  Modified Medications   No medications on file  Discontinued Medications   No medications on file

## 2014-06-18 NOTE — Progress Notes (Signed)
Pre visit review using our clinic review tool, if applicable. No additional management support is needed unless otherwise documented below in the visit note. 

## 2014-11-11 ENCOUNTER — Encounter: Payer: Self-pay | Admitting: Family Medicine

## 2014-11-11 ENCOUNTER — Ambulatory Visit (INDEPENDENT_AMBULATORY_CARE_PROVIDER_SITE_OTHER): Payer: 59 | Admitting: Family Medicine

## 2014-11-11 VITALS — BP 114/80 | HR 103 | Temp 98.7°F | Ht 67.5 in | Wt 189.8 lb

## 2014-11-11 DIAGNOSIS — H6593 Unspecified nonsuppurative otitis media, bilateral: Secondary | ICD-10-CM | POA: Diagnosis not present

## 2014-11-11 MED ORDER — AMOXICILLIN 500 MG PO CAPS: 1000.0000 mg | ORAL_CAPSULE | Freq: Two times a day (BID) | ORAL | Status: DC

## 2014-11-11 NOTE — Patient Instructions (Signed)
Complete a 10 day course of antibiotics.  Saline drops in eyes for relief.  Tylenol every 6-8 hours as needed for pain. Nasal saline irrigation or spray 2-3 times a day.  Call if not improving as expected. Go to ER for severe shortness of breath.

## 2014-11-11 NOTE — Assessment & Plan Note (Signed)
Treat with amox x 10 days, Nasal saline irrigation. Tylenol for pain with history of ulcer.

## 2014-11-11 NOTE — Progress Notes (Signed)
   Subjective:    Patient ID: Mathew Vazquez, male    DOB: [birth year], 39 y.o.   MRN: [id number]  Cough This is a new problem. The current episode started in the past 7 days. The problem has been gradually worsening. The cough is productive of sputum. Associated symptoms include ear congestion, nasal congestion and shortness of breath. Pertinent negatives include no chills, myalgias or postnasal drip. Associated symptoms comments: Both ears painful  eyes red and goopy: this AM.. Nothing aggravates the symptoms. Risk factors for lung disease include smoking/tobacco exposure (foremer smoker). Treatments tried: day quil, mucinex, tyenol. The treatment provided mild relief. His past medical history is significant for asthma. There is no history of bronchiectasis, bronchitis, COPD, emphysema, environmental allergies or pneumonia. no history of ear issues      Review of Systems  Constitutional: Negative for chills.  HENT: Negative for postnasal drip.   Respiratory: Positive for shortness of breath.   Musculoskeletal: Negative for myalgias.  Allergic/Immunologic: Negative for environmental allergies.       Objective:   Physical Exam  Constitutional: Vital signs are normal. He appears well-developed and well-nourished.  Non-toxic appearance. He does not appear ill. No distress.  HENT:  Head: Normocephalic and atraumatic.  Right Ear: Hearing, external ear and ear canal normal. No tenderness. No foreign bodies. Tympanic membrane is injected, erythematous and bulging. Tympanic membrane is not retracted. A middle ear effusion is present.  Left Ear: Hearing, external ear and ear canal normal. No tenderness. No foreign bodies. Tympanic membrane is injected, erythematous and bulging. Tympanic membrane is not retracted. A middle ear effusion is present.  Nose: Mucosal edema and rhinorrhea present. Right sinus exhibits maxillary sinus tenderness. Right sinus exhibits no frontal sinus tenderness. Left  sinus exhibits maxillary sinus tenderness. Left sinus exhibits no frontal sinus tenderness.  Mouth/Throat: Uvula is midline, oropharynx is clear and moist and mucous membranes are normal. Normal dentition. No dental caries. No oropharyngeal exudate or tonsillar abscesses.  Eyes: Conjunctivae, EOM and lids are normal. Pupils are equal, round, and reactive to light. Lids are everted and swept, no foreign bodies found.  Neck: Trachea normal, normal range of motion and phonation normal. Neck supple. Carotid bruit is not present. No thyroid mass and no thyromegaly present.  Cardiovascular: Normal rate, regular rhythm, S1 normal, S2 normal, normal heart sounds, intact distal pulses and normal pulses.  Exam reveals no gallop.   No murmur heard. Pulmonary/Chest: Effort normal and breath sounds normal. No respiratory distress. He has no wheezes. He has no rhonchi. He has no rales.  Abdominal: Soft. Normal appearance and bowel sounds are normal. There is no hepatosplenomegaly. There is no tenderness. There is no rebound, no guarding and no CVA tenderness. No hernia.  Neurological: He is alert. He has normal reflexes.  Skin: Skin is warm, dry and intact. No rash noted.  Psychiatric: He has a normal mood and affect. His speech is normal and behavior is normal. Judgment normal.          Assessment & Plan:

## 2014-11-11 NOTE — Progress Notes (Signed)
Pre visit review using our clinic review tool, if applicable. No additional management support is needed unless otherwise documented below in the visit note. 

## 2014-12-01 ENCOUNTER — Other Ambulatory Visit: Payer: Self-pay | Admitting: Family Medicine

## 2014-12-03 ENCOUNTER — Other Ambulatory Visit: Payer: Self-pay | Admitting: Family Medicine

## 2014-12-03 NOTE — Telephone Encounter (Signed)
Last office visit 11/11/2014 with Dr. Ermalene SearingBedsole for ear pain.  Last refilled 01/23/2014 for #30 with 1 refill.  Refill?

## 2015-06-19 ENCOUNTER — Ambulatory Visit
Admission: RE | Admit: 2015-06-19 | Discharge: 2015-06-19 | Disposition: A | Discharge status: Home / Self Care | Payer: Self-pay | Source: Ambulatory Visit | Attending: Primary Care | Admitting: Primary Care

## 2015-06-19 ENCOUNTER — Telehealth: Payer: Self-pay | Admitting: Family Medicine

## 2015-06-19 ENCOUNTER — Encounter: Payer: Self-pay | Admitting: Primary Care

## 2015-06-19 ENCOUNTER — Ambulatory Visit (INDEPENDENT_AMBULATORY_CARE_PROVIDER_SITE_OTHER): Payer: Self-pay | Admitting: Primary Care

## 2015-06-19 VITALS — BP 136/88 | HR 109 | Temp 97.4°F | Ht 67.5 in | Wt 201.8 lb

## 2015-06-19 DIAGNOSIS — R1011 Right upper quadrant pain: Secondary | ICD-10-CM

## 2015-06-19 LAB — CBC WITH DIFFERENTIAL/PLATELET
Basophils Absolute: 0.1 10*3/uL (ref 0.0–0.1)
Basophils Relative: 0.6 % (ref 0.0–3.0)
EOS ABS: 0.4 10*3/uL (ref 0.0–0.7)
Eosinophils Relative: 4.3 % (ref 0.0–5.0)
HCT: 50.6 % (ref 39.0–52.0)
HEMOGLOBIN: 17.2 g/dL — AB (ref 13.0–17.0)
LYMPHS PCT: 17.1 % (ref 12.0–46.0)
Lymphs Abs: 1.8 10*3/uL (ref 0.7–4.0)
MCHC: 34 g/dL (ref 30.0–36.0)
MCV: 95.6 fl (ref 78.0–100.0)
Monocytes Absolute: 0.9 10*3/uL (ref 0.1–1.0)
Monocytes Relative: 9.1 % (ref 3.0–12.0)
Neutro Abs: 7.1 10*3/uL (ref 1.4–7.7)
Neutrophils Relative %: 68.9 % (ref 43.0–77.0)
Platelets: 226 10*3/uL (ref 150.0–400.0)
RBC: 5.3 Mil/uL (ref 4.22–5.81)
RDW: 12.6 % (ref 11.5–15.5)
WBC: 10.2 10*3/uL (ref 4.0–10.5)

## 2015-06-19 LAB — COMPREHENSIVE METABOLIC PANEL
ALT: 107 U/L — AB (ref 0–53)
AST: 64 U/L — AB (ref 0–37)
Albumin: 5 g/dL (ref 3.5–5.2)
Alkaline Phosphatase: 66 U/L (ref 39–117)
BILIRUBIN TOTAL: 0.6 mg/dL (ref 0.2–1.2)
BUN: 13 mg/dL (ref 6–23)
CO2: 27 meq/L (ref 19–32)
CREATININE: 1.25 mg/dL (ref 0.40–1.50)
Calcium: 11 mg/dL — ABNORMAL HIGH (ref 8.4–10.5)
Chloride: 99 mEq/L (ref 96–112)
GFR: 68.43 mL/min (ref >60.00)
Glucose, Bld: 104 mg/dL — ABNORMAL HIGH (ref 70–99)
Potassium: 4.1 mEq/L (ref 3.5–5.1)
Sodium: 136 mEq/L (ref 135–145)
TOTAL PROTEIN: 8.3 g/dL (ref 6.0–8.3)

## 2015-06-19 NOTE — Progress Notes (Signed)
Subjective:    Patient ID: Mathew Vazquez, male    DOB: 05/28/1975, 40 y.o.   MRN: [id number]  HPI  Mr. Bohman is a 40 year old male with a history of GERD, alcohol abuse, and gastric ulcer who presents today with a chief complaint of abdominal pain. His pain is located to the RUQ and RMQ and has been present since yesterday in the late morning. Denies fevers, nausea, vomiting, diarrhea, constipation, heavy lifting, urinary symptoms. His pain is worse with certain movement (bending forward, twisting, coughing). He's not taken anything OTC for his symptoms. He's not noticed a bulge.  Review of Systems  Constitutional: Negative for fever, appetite change and fatigue.  Respiratory: Negative for shortness of breath.   Cardiovascular: Negative for chest pain.  Gastrointestinal: Positive for abdominal pain. Negative for nausea, vomiting, diarrhea and constipation.  Neurological: Negative for dizziness.       Past Medical History  Diagnosis Date  . Depression   . Asthma   . GERD (gastroesophageal reflux disease)   . Gastric ulcer   . Hyperlipidemia LDL goal <100 12/17/2013  . Alcohol abuse      Social History   Social History  . Marital Status: Single    Spouse Name: N/A  . Number of Children: N/A  . Years of Education: N/A   Occupational History  . insurance    Social History Main Topics  . Smoking status: Former Smoker    Quit date: 01/24/2001  . Smokeless tobacco: Never Used  . Alcohol Use: 25.2 oz/week    42 Cans of beer per week     Comment: 6-12 drinks a night  . Drug Use: No  . Sexual Activity: Not on file   Other Topics Concern  . Not on file   Social History Narrative   Daily caffeine use 3-4 daily   No regular exercise   6-12 beers daily   Single    Past Surgical History  Procedure Laterality Date  . Tonsillectomy  1990    Family History  Problem Relation Age of Onset  . Depression      Family history  . Diabetes      1st degree  relative  . Hyperlipidemia      Family history  . Hypertension      Family history  . Skin cancer      Family history    No Known Allergies  Current Outpatient Prescriptions on File Prior to Visit  Medication Sig Dispense Refill  . atorvastatin (LIPITOR) 20 MG tablet Take 1 tablet (20 mg total) by mouth daily. 90 tablet 3  . calcium carbonate (TUMS - DOSED IN MG ELEMENTAL CALCIUM) 500 MG chewable tablet Chew 1 tablet by mouth daily as needed for indigestion or heartburn.    . meclizine (ANTIVERT) 25 MG tablet Take 1 tablet (25 mg total) by mouth 3 (three) times daily as needed for dizziness. 30 tablet 0  . naltrexone (DEPADE) 50 MG tablet TAKE ONE TABLET BY MOUTH ONE TIME DAILY 30 tablet 1  . tadalafil (CIALIS) 20 MG tablet Take 0.5-1 tablets (10-20 mg total) by mouth every other day as needed for erectile dysfunction. 8 tablet 11  . triamcinolone cream (KENALOG) 0.5 % Apply 1 application topically 2 (two) times daily. 30 g 0   No current facility-administered medications on file prior to visit.    BP 136/88 mmHg  Pulse 109  Temp(Src) 97.4 F (36.3 C) (Oral)  Ht 5' 7.5" (1.715 m)  Wt  201 lb 12.8 oz (91.536 kg)  BMI 31.12 kg/m2  SpO2 97%    Objective:   Physical Exam  Constitutional: He appears well-nourished. He does not appear ill.  Cardiovascular: Normal rate and regular rhythm.   Pulmonary/Chest: Effort normal and breath sounds normal.  Abdominal: Soft. Bowel sounds are normal. There is tenderness in the right upper quadrant. There is no rigidity, no CVA tenderness, no tenderness at McBurney's point and negative Murphy's sign. No hernia.  Skin: Skin is warm and dry.          Assessment & Plan:  Abdominal Pain:  Mostly located to RUQ and RMQ near site of liver. Present since yesterday, history of alcohol abuse and continues to drink. Normal LFT's from 1 year ago, he does report he [place]. Tender  to abdomen near liver, does not feel to be enlarged.  Suspect pain likely coming from liver, however, will obtain US and labs to ensure. Less likely to have renal stones, hernia, appendicitis. Ultrasound and labs pending.  Discussed ER precautions over the weekend.

## 2015-06-19 NOTE — Progress Notes (Signed)
Pre visit review using our clinic review tool, if applicable. No additional management support is needed unless otherwise documented below in the visit note. 

## 2015-06-19 NOTE — Telephone Encounter (Signed)
[facility] - Client TELEPHONE ADVICE RECORD [facility] Patient Name: Holley RaringCHRIS Ohmann DOB: [birth year] Initial Comment Caller states he's having shooting pain on the lower abdominal area. Nurse Assessment Guidelines Guideline Title Affirmed Question Affirmed Notes Final Disposition User FINAL ATTEMPT MADE - message left Debera Latalston, RN, Tinnie GensJeffrey Comments Unable to hear caller on cell phone. After repeated attempts to contact caller unable to speak with caller.

## 2015-06-19 NOTE — Telephone Encounter (Signed)
Pt called and if standing no pain but pain upper rt abd upon movement;No N&V, fever or difficulty breathing. Pt scheduled appt with Mayra ReelKate Clark NP 06/19/15 at 11 AM.

## 2015-06-19 NOTE — Telephone Encounter (Signed)
Left v/m requesting cb. Spoke with pt's mom and pt only has one contact #.

## 2015-06-19 NOTE — Telephone Encounter (Signed)
Pt returned your call - please call back at [phone] Thank you

## 2015-06-19 NOTE — Telephone Encounter (Signed)
Pt returned your call - please call back at [phone] °Thank you  °

## 2015-06-19 NOTE — Patient Instructions (Signed)
Complete lab work prior to leaving today. I will notify you of your results once received.   Stop by the front desk and speak with either Shirlee LimerickMarion or Revonda StandardAllison regarding your Ultrasound.  Reduce consumption of alcohol as this pain could likely be attributed to your liver.   If your pain becomes worse and/or you develop fevers, nausea, vomiting, then go to the Emergency Department.  I will be in touch with you later.  It was a pleasure meeting you!

## 2015-06-23 ENCOUNTER — Telehealth: Payer: Self-pay | Admitting: Primary Care

## 2015-06-23 NOTE — Telephone Encounter (Signed)
Pt returned chans call, please call back  Thank you

## 2015-06-23 NOTE — Telephone Encounter (Signed)
Message left for patient to return my call. Please see result note on 06/19/2015.

## 2015-06-23 NOTE — Telephone Encounter (Signed)
Spoken and notified patient of Kate's comments. Patient verbalized understanding. 

## 2015-11-19 ENCOUNTER — Encounter: Payer: Self-pay | Admitting: Family Medicine

## 2015-11-19 ENCOUNTER — Ambulatory Visit (INDEPENDENT_AMBULATORY_CARE_PROVIDER_SITE_OTHER): Payer: Self-pay | Admitting: Family Medicine

## 2015-11-19 VITALS — BP 126/86 | HR 86 | Temp 98.3°F | Ht 67.5 in | Wt 203.2 lb

## 2015-11-19 DIAGNOSIS — R7989 Other specified abnormal findings of blood chemistry: Secondary | ICD-10-CM

## 2015-11-19 DIAGNOSIS — R945 Abnormal results of liver function studies: Principal | ICD-10-CM

## 2015-11-19 LAB — HEPATIC FUNCTION PANEL
ALBUMIN: 4.3 g/dL (ref 3.6–5.1)
ALT: 32 U/L (ref 9–46)
AST: 25 U/L (ref 10–40)
Alkaline Phosphatase: 72 U/L (ref 40–115)
BILIRUBIN INDIRECT: 0.3 mg/dL (ref 0.2–1.2)
Bilirubin, Direct: 0.1 mg/dL (ref <=0.2)
TOTAL PROTEIN: 7.3 g/dL (ref 6.1–8.1)
Total Bilirubin: 0.4 mg/dL (ref 0.2–1.2)

## 2015-11-19 NOTE — Progress Notes (Signed)
Pre visit review using our clinic review tool, if applicable. No additional management support is needed unless otherwise documented below in the visit note. 

## 2015-11-19 NOTE — Progress Notes (Signed)
Dr. Karleen Hampshire T. Tamryn Popko, MD, CAQ [facility] [address] [place], [address] Phone: [phone] Fax: [phone]  11/19/2015  Patient: Mathew Vazquez, MRN: [id number], DOB: [birth year], 40 y.o.  Primary Physician:  Hannah Beat, MD   Chief Complaint  Patient presents with  . Follow-up    on liver   Subjective:   Mathew Vazquez is a 40 y.o. very pleasant male patient who presents with the following:  The patient is not very well, and I talked to him a number of times about excessive alcohol use. He had some very significant elevations in his AST and ALT a few months ago, and he saw Mrs. Clark. Liver ultrasound showed some fatty liver changes as well.  He tells me that he has significantly decreased his alcohol intake, drinking only 2 or 3 days a week with significant decrease.  Comprehensive Metabolic Panel:    Component Value Date/Time   NA 136 06/19/2015 1131   K 4.1 06/19/2015 1131   CL 99 06/19/2015 1131   CO2 27 06/19/2015 1131   BUN 13 06/19/2015 1131   CREATININE 1.25 06/19/2015 1131   GLUCOSE 104 (H) 06/19/2015 1131   CALCIUM 11.0 (H) 06/19/2015 1131   AST 64 (H) 06/19/2015 1131   ALT 107 (H) 06/19/2015 1131   ALKPHOS 66 06/19/2015 1131   BILITOT 0.6 06/19/2015 1131   PROT 8.3 06/19/2015 1131   ALBUMIN 5.0 06/19/2015 1131    US Abdomen Limited Ruq  Result Date: 06/19/2015 CLINICAL DATA:  Right upper quadrant pain for 1 day. History of alcohol abuse. EXAM: US ABDOMEN LIMITED - RIGHT UPPER QUADRANT COMPARISON:  None. FINDINGS: Gallbladder: No gallstones or wall thickening visualized. No sonographic Murphy sign noted by sonographer. Common bile duct: Diameter: Normal, 4 mm. Liver: Moderately increased in echogenicity.  No focal liver lesion. IMPRESSION: 1.  No acute process or explanation for right upper quadrant pain. 2. Increased hepatic echogenicity, likely related to the hepatic steatosis. Electronically  Signed   By: Jeronimo Greaves M.D.   On: 06/19/2015 16:17   Past Medical History, Surgical History, Social History, Family History, Problem List, Medications, and Allergies have been reviewed and updated if relevant.  Patient Active Problem List   Diagnosis Date Noted  . Hyperlipidemia LDL goal <100 12/17/2013    Priority: Medium  . Elevated LFTs 12/17/2013    Priority: Medium  . Alcohol abuse 04/06/2011    Priority: Medium  . Major depressive disorder 07/01/2008  . GERD 07/01/2008  . GASTRIC ULCER 07/01/2008  . ALLERGY 07/01/2008    Past Medical History:  Diagnosis Date  . Alcohol abuse   . Asthma   . Depression   . Gastric ulcer   . GERD (gastroesophageal reflux disease)   . Hyperlipidemia LDL goal <100 12/17/2013    Past Surgical History:  Procedure Laterality Date  . TONSILLECTOMY  1990    Social History   Social History  . Marital status: Single    Spouse name: N/A  . Number of children: N/A  . Years of education: N/A   Occupational History  . insurance [employer]   Social History Main Topics  . Smoking status: Former Smoker    Quit date: 01/24/2001  . Smokeless tobacco: Never Used  . Alcohol use 25.2 oz/week    42 Cans of beer per week     Comment: 6-12 drinks a night  . Drug use: No  . Sexual activity: Not on file  Other Topics Concern  . Not on file   Social History Narrative   Daily caffeine use 3-4 daily   No regular exercise   6-12 beers daily   Single    Family History  Problem Relation Age of Onset  . Depression      Family history  . Diabetes      1st degree relative  . Hyperlipidemia      Family history  . Hypertension      Family history  . Skin cancer      Family history    No Known Allergies  Medication list reviewed and updated in full in [facility].   GEN: No acute illnesses, no fevers, chills. GI: No n/v/d, eating normally Pulm: No SOB Interactive and getting along well at home.  Otherwise,  ROS is as per the HPI.  Objective:   BP 126/86   Pulse 86   Temp 98.3 F (36.8 C) (Oral)   Ht 5' 7.5" (1.715 m)   Wt 203 lb 4 oz (92.2 kg)   BMI 31.36 kg/m   GEN: WDWN, NAD, Non-toxic, A & O x 3 HEENT: Atraumatic, Normocephalic. Neck supple. No masses, No LAD. Ears and Nose: No external deformity. ABD: S, NT, ND, + BS, No rebound, no splenomegaly. I do feel a liver edge approximately 1 and a half to 2 inches below the right costal margin. EXTR: No c/c/e NEURO Normal gait.  PSYCH: Normally interactive. Conversant. Not depressed or anxious appearing.  Calm demeanor.   Laboratory and Imaging Data:  Assessment and Plan:   Elevated LFTs - Plan: Hepatitis B core antibody, IgM, Hepatitis B surface antibody, Hepatitis B surface antigen, Hepatitis C antibody, Hepatic function panel, CANCELED: Hepatic function panel  I cannot see where the patient has been checked for hepatitis, as well as checking for hepatitis B and C.  Recheck hepatic function.  Additional counseling on alcohol.  Follow-up: No Follow-up on file.  Orders Placed This Encounter  Procedures  . Hepatitis B core antibody, IgM  . Hepatitis B surface antibody  . Hepatitis B surface antigen  . Hepatitis C antibody  . Hepatic function panel    Signed,  Karleen HampshireSpencer T. Jariya Reichow, MD   Patient's Medications  New Prescriptions   No medications on file  Previous Medications   ATORVASTATIN (LIPITOR) 20 MG TABLET    Take 1 tablet (20 mg total) by mouth daily.   CALCIUM CARBONATE (TUMS - DOSED IN MG ELEMENTAL CALCIUM) 500 MG CHEWABLE TABLET    Chew 1 tablet by mouth daily as needed for indigestion or heartburn.   MECLIZINE (ANTIVERT) 25 MG TABLET    Take 1 tablet (25 mg total) by mouth 3 (three) times daily as needed for dizziness.   NALTREXONE (DEPADE) 50 MG TABLET    TAKE ONE TABLET BY MOUTH ONE TIME DAILY   TADALAFIL (CIALIS) 20 MG TABLET    Take 0.5-1 tablets (10-20 mg total) by mouth every other day as needed for  erectile dysfunction.   TRIAMCINOLONE CREAM (KENALOG) 0.5 %    Apply 1 application topically 2 (two) times daily.  Modified Medications   No medications on file  Discontinued Medications   No medications on file

## 2015-11-20 ENCOUNTER — Encounter: Payer: Self-pay | Admitting: *Deleted

## 2015-11-20 LAB — HEPATITIS B SURFACE ANTIBODY,QUALITATIVE: Hep B S Ab: POSITIVE — AB

## 2015-11-20 LAB — HEPATITIS B CORE ANTIBODY, IGM: Hep B C IgM: NONREACTIVE

## 2015-11-20 LAB — HEPATITIS C ANTIBODY: HCV AB: NEGATIVE

## 2015-11-20 LAB — HEPATITIS B SURFACE ANTIGEN: Hepatitis B Surface Ag: NEGATIVE

## 2016-01-29 ENCOUNTER — Other Ambulatory Visit: Payer: Self-pay | Admitting: Family Medicine

## 2016-01-29 NOTE — Telephone Encounter (Signed)
Last office visit 11/19/2015 for Elevated LFTs.  Last CPE 06/18/2014.  Ok to refill?

## 2016-07-04 IMAGING — US US ABDOMEN LIMITED
1 series · 14 of 25 positions shown · non-contrast
Comparison: None.

CLINICAL DATA: Right upper quadrant pain for 1 day. History of
alcohol abuse.

EXAM:
US ABDOMEN LIMITED - RIGHT UPPER QUADRANT

[Series 1: us abdomen limited · 0.17mm/px · 14 of 60 slices shown]
[im 1/60]
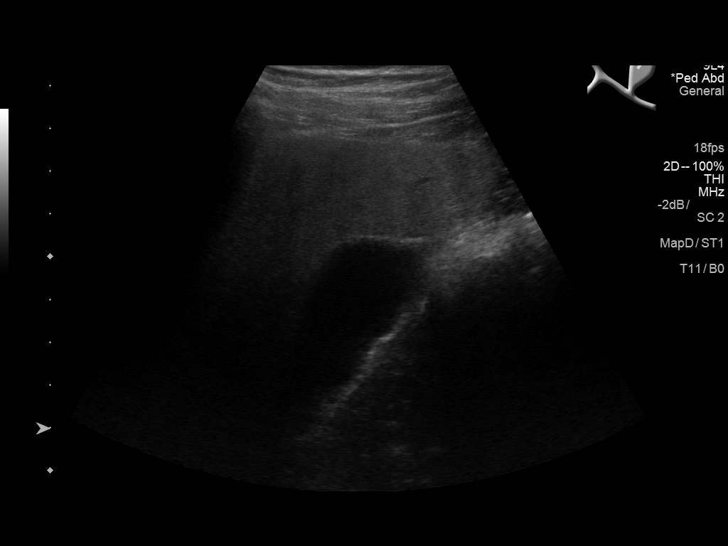
[im 5/60]
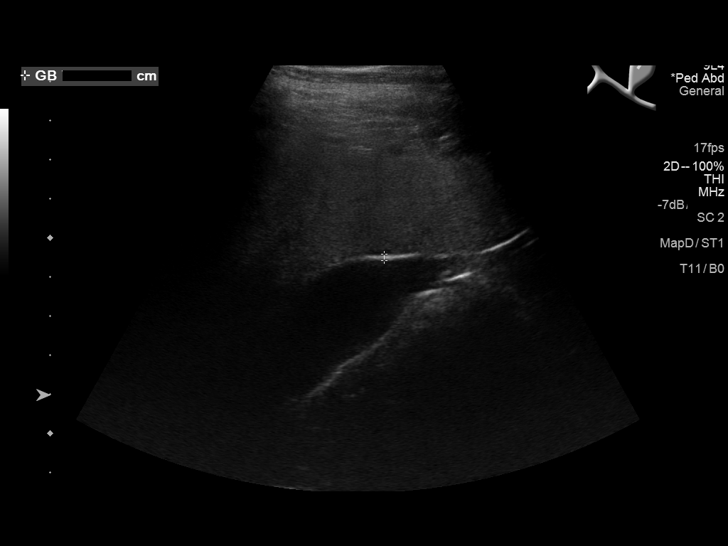
[im 10/60]
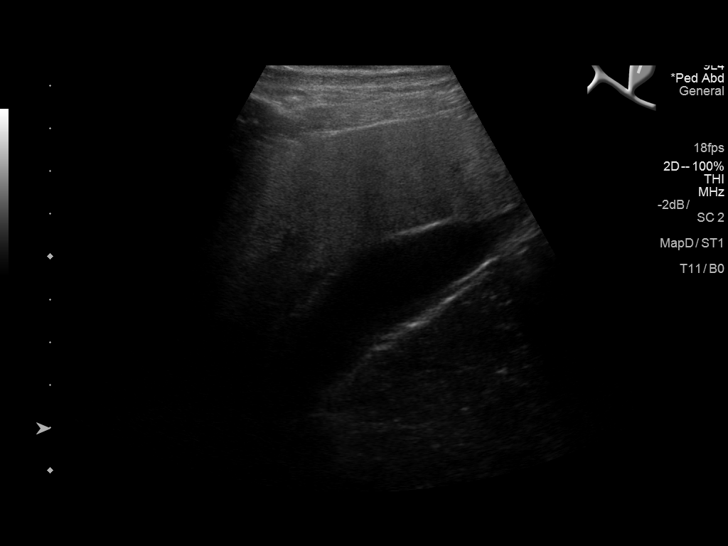
[im 15/60]
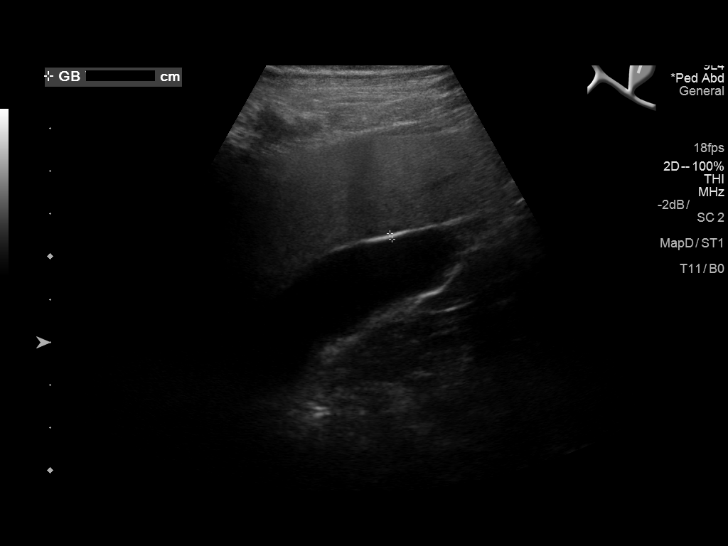
[im 20/60]
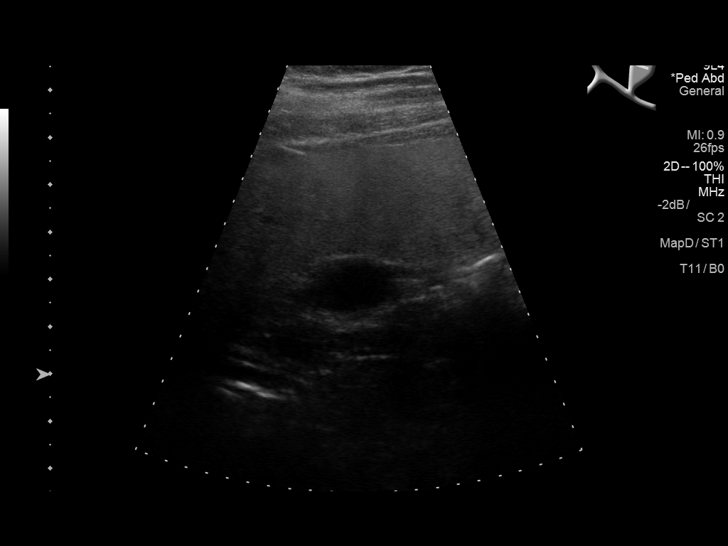
[im 23/60]
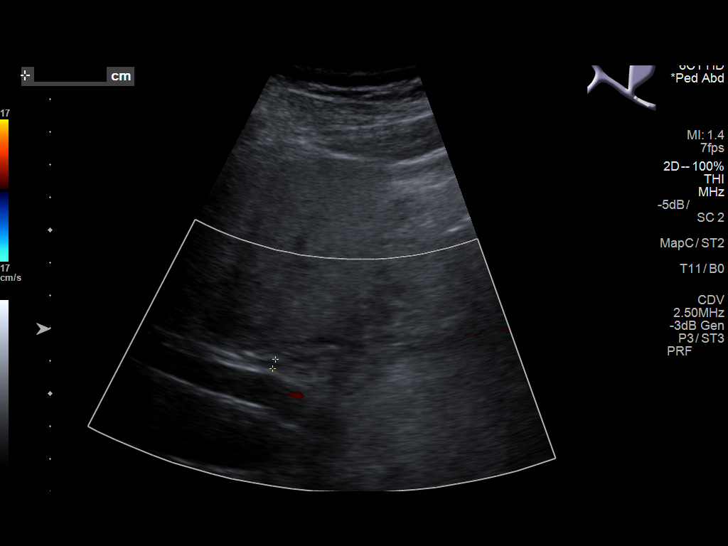
[im 28/60]
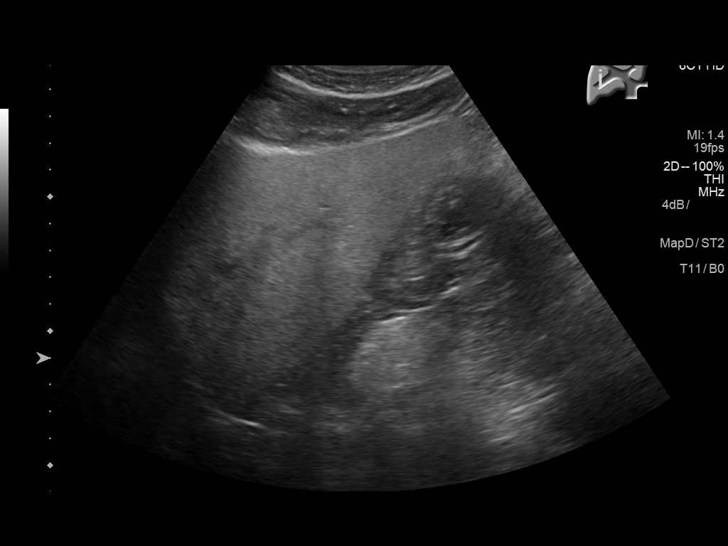
[im 32/60]
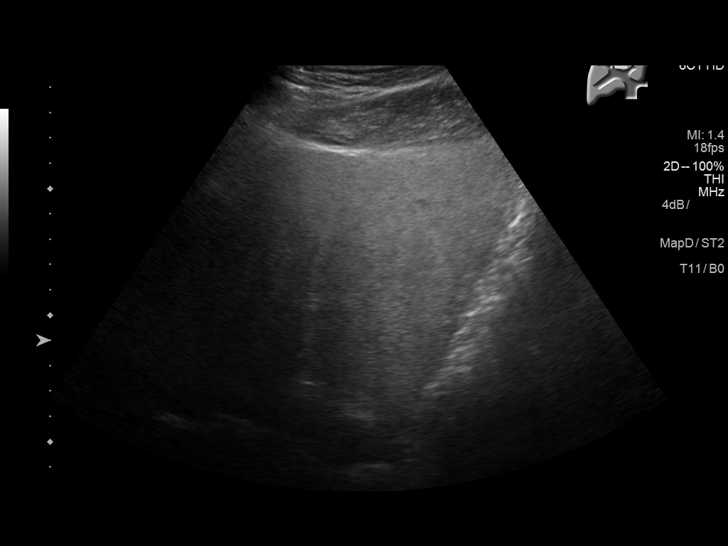
[im 37/60]
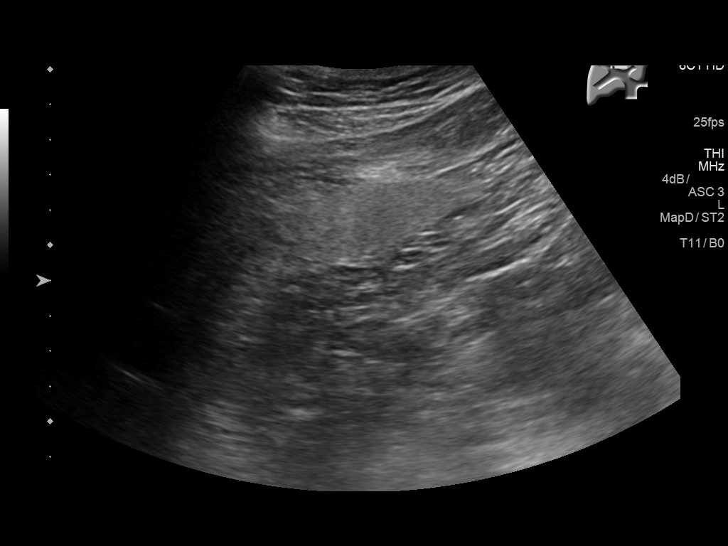
[im 40/60]
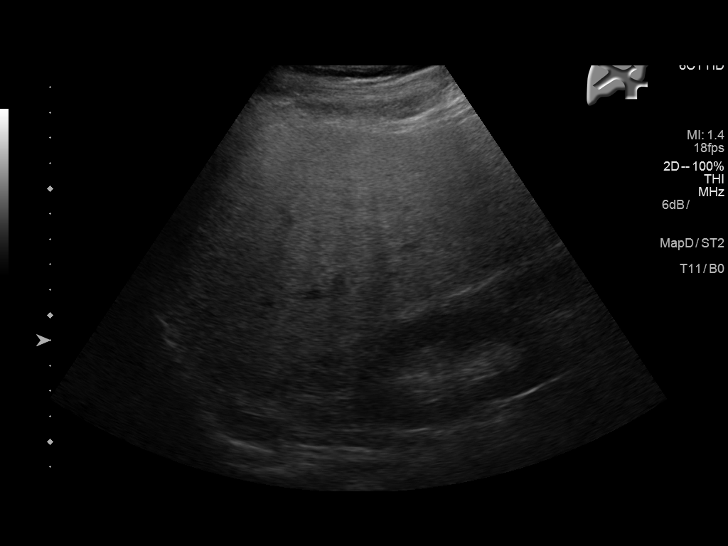
[im 45/60]
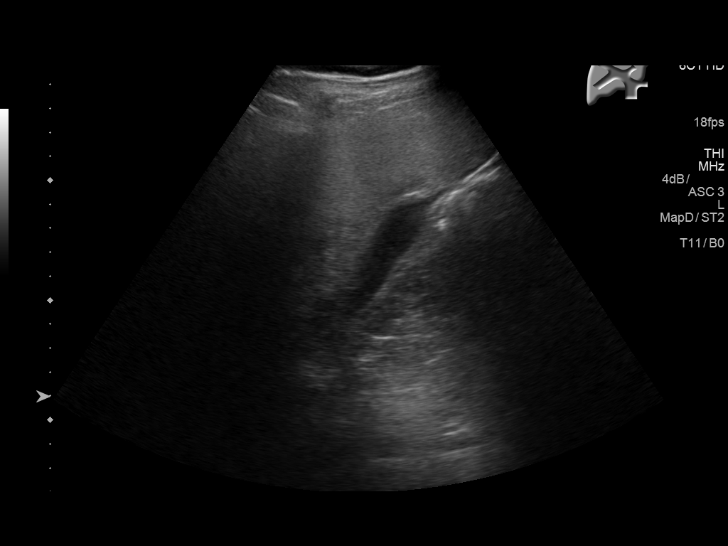
[im 50/60]
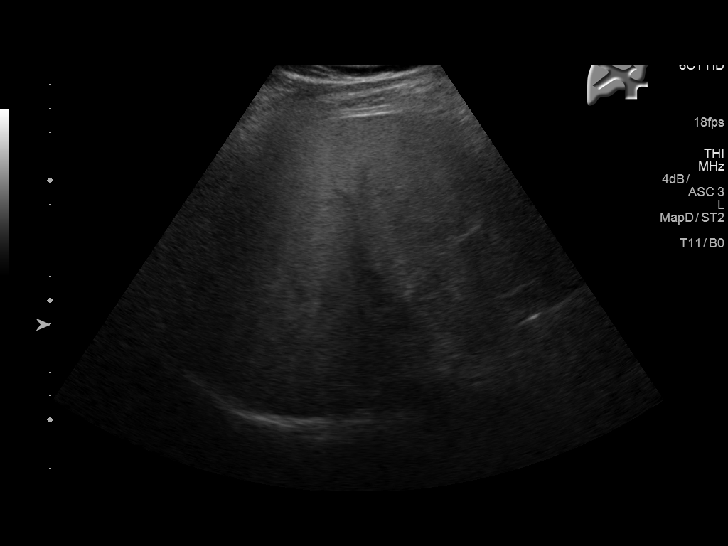
[im 55/60]
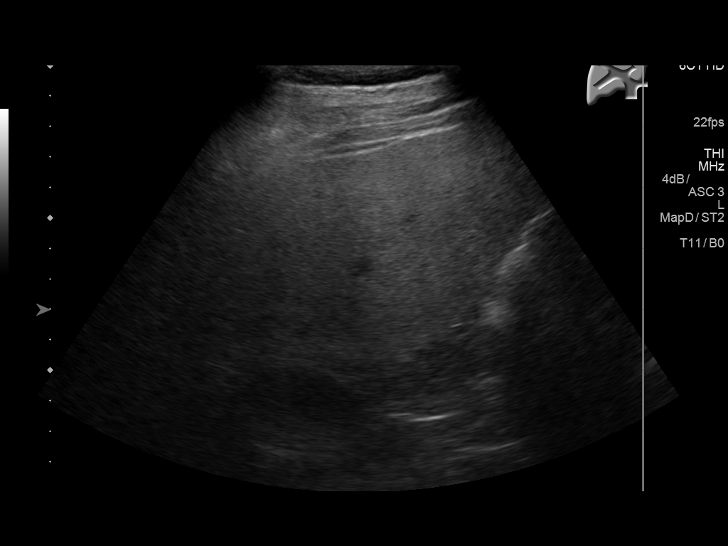
[im 60/60]
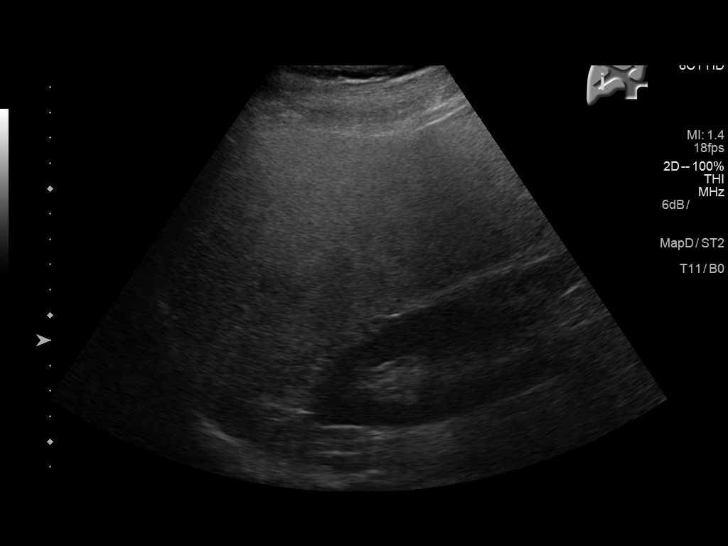

[14 of 25 positions shown; findings below may reference images not displayed]

FINDINGS: Gallbladder:

No gallstones or wall thickening visualized. No sonographic Murphy
sign noted by sonographer.

Common bile duct:

Diameter: Normal, 4 mm.

Liver:

Moderately increased in echogenicity.  No focal liver lesion.
IMPRESSION: 1.  No acute process or explanation for right upper quadrant pain.
2. Increased hepatic echogenicity, likely related to the hepatic
steatosis.

## 2017-05-26 ENCOUNTER — Other Ambulatory Visit: Payer: Self-pay | Admitting: Family Medicine
